# Patient Record
Sex: Female | Born: 1945 | Race: White | Hispanic: No | Marital: Married | Smoking: Never smoker
Health system: Southern US, Community
[De-identification: ages and names within clinical notes are randomized; demographics above are authoritative.]

## PROBLEM LIST (undated history)

## (undated) DIAGNOSIS — I10 Essential (primary) hypertension: Secondary | ICD-10-CM

## (undated) DIAGNOSIS — E119 Type 2 diabetes mellitus without complications: Secondary | ICD-10-CM

## (undated) HISTORY — PX: DILATION AND CURETTAGE OF UTERUS: SHX78

## (undated) HISTORY — DX: Type 2 diabetes mellitus without complications: E11.9

---

## 2014-12-03 ENCOUNTER — Encounter (HOSPITAL_COMMUNITY): Payer: Self-pay | Admitting: Emergency Medicine

## 2014-12-03 ENCOUNTER — Emergency Department (HOSPITAL_COMMUNITY)
Admission: EM | Admit: 2014-12-03 | Discharge: 2014-12-03 | Disposition: A | Discharge status: Home / Self Care | Payer: Medicare Other | Attending: Emergency Medicine | Admitting: Emergency Medicine

## 2014-12-03 DIAGNOSIS — Z7982 Long term (current) use of aspirin: Secondary | ICD-10-CM | POA: Insufficient documentation

## 2014-12-03 DIAGNOSIS — Z79899 Other long term (current) drug therapy: Secondary | ICD-10-CM | POA: Diagnosis not present

## 2014-12-03 DIAGNOSIS — Y998 Other external cause status: Secondary | ICD-10-CM | POA: Diagnosis not present

## 2014-12-03 DIAGNOSIS — Y9289 Other specified places as the place of occurrence of the external cause: Secondary | ICD-10-CM | POA: Diagnosis not present

## 2014-12-03 DIAGNOSIS — I10 Essential (primary) hypertension: Secondary | ICD-10-CM | POA: Insufficient documentation

## 2014-12-03 DIAGNOSIS — Y9389 Activity, other specified: Secondary | ICD-10-CM | POA: Diagnosis not present

## 2014-12-03 DIAGNOSIS — S1086XA Insect bite of other specified part of neck, initial encounter: Secondary | ICD-10-CM | POA: Diagnosis not present

## 2014-12-03 DIAGNOSIS — S1096XA Insect bite of unspecified part of neck, initial encounter: Secondary | ICD-10-CM

## 2014-12-03 DIAGNOSIS — W57XXXA Bitten or stung by nonvenomous insect and other nonvenomous arthropods, initial encounter: Secondary | ICD-10-CM | POA: Insufficient documentation

## 2014-12-03 HISTORY — DX: Essential (primary) hypertension: I10

## 2014-12-03 MED ORDER — CIPROFLOXACIN HCL 500 MG PO TABS: 500.0000 mg | ORAL_TABLET | Freq: Two times a day (BID) | ORAL | Status: DC

## 2014-12-03 NOTE — ED Notes (Signed)
Pt states that she thinks she was bitten on the neck by a spider a few days ago.

## 2014-12-03 NOTE — Discharge Instructions (Signed)
Please apply warm compress to the bite area 2 times daily over the next 4 or 5 days. Please use Cipro 2 times daily with food. Please see your primary physician, or return to the emergency department if any changes, or signs of advancing infection.

## 2014-12-03 NOTE — ED Provider Notes (Signed)
CSN: [id number]     Arrival date & time 12/03/14  1111 History   First MD Initiated Contact with Patient 12/03/14 1202     Chief Complaint  Patient presents with  . Insect Bite     (Consider location/radiation/quality/duration/timing/severity/associated sxs/prior Treatment) HPI Comments: Patient is a 69 year old female who presents to the emergency department with an insect bite to the neck.  The patient states that on Monday, May 9 she noticed a bite to the neck. She later noticed some "brown skin", and pulled this off thinking that it may have been a tick. She discovered it was not a tick, but to day noticed that there was redness present and her family said that it looked like there was some blisters present. She says that she researched it on "[employer]", and thought that it may look like a brown recluse spider bite. She became concerned and came to the emergency department for evaluation. There's been no fever, no chills, no rash, no nausea, no vomiting, and no high fevers reported.  The history is provided by the patient.    Past Medical History  Diagnosis Date  . Hypertension    History reviewed. No pertinent past surgical history. History reviewed. No pertinent family history. History  Substance Use Topics  . Smoking status: Never Smoker   . Smokeless tobacco: Not on file  . Alcohol Use: No   OB History    No data available     Review of Systems  Constitutional: Negative for activity change.       All ROS Neg except as noted in HPI  HENT: Negative for nosebleeds.   Eyes: Negative for photophobia and discharge.  Respiratory: Negative for cough, shortness of breath and wheezing.   Cardiovascular: Negative for chest pain and palpitations.  Gastrointestinal: Negative for abdominal pain and blood in stool.  Genitourinary: Negative for dysuria, frequency and hematuria.  Musculoskeletal: Negative for back pain, arthralgias and neck pain.  Skin: Negative.   Neurological:  Negative for dizziness, seizures and speech difficulty.  Psychiatric/Behavioral: Negative for hallucinations and confusion.      Allergies  Review of patient's allergies indicates no known allergies.  Home Medications   Prior to Admission medications   Medication Sig Start Date End Date Taking? Authorizing Provider  aspirin EC 81 MG tablet Take 81 mg by mouth daily.   Yes Historical Provider, MD  ibuprofen (ADVIL,MOTRIN) 200 MG tablet Take 400 mg by mouth every 6 (six) hours as needed for moderate pain.   Yes Historical Provider, MD  losartan-hydrochlorothiazide (HYZAAR) 100-12.5 MG per tablet Take 1 tablet by mouth daily.   Yes Historical Provider, MD  Omega-3 Fatty Acids (FISH OIL) 1200 MG CAPS Take 1,200 mg by mouth 1 day or 1 dose.   Yes Historical Provider, MD  OVER THE COUNTER MEDICATION Take 1 tablet by mouth daily. O.p.c. Herbal   Yes Historical Provider, MD  TURMERIC PO Take 1 capsule by mouth daily.   Yes Historical Provider, MD   BP 138/85 mmHg  Pulse 95  Temp(Src) 97.8 F (36.6 C) (Oral)  Resp 18  Ht 5\' 5"  (1.651 m)  Wt 230 lb (104.327 kg)  BMI 38.27 kg/m2  SpO2 97% Physical Exam  Constitutional: She is oriented to person, place, and time. She appears well-developed and well-nourished.  Non-toxic appearance.  HENT:  Head: Normocephalic.  Right Ear: Tympanic membrane and external ear normal.  Left Ear: Tympanic membrane and external ear normal.  Eyes: EOM and lids are normal. Pupils  are equal, round, and reactive to light.  Neck: Normal range of motion. Neck supple. Carotid bruit is not present.  There is a nickel size reddened area of the back of the neck. The area is not hot. There is no red streaks going into the scalp. There is no drainage present. There no satellite lesions noted.  Cardiovascular: Normal rate, regular rhythm, normal heart sounds, intact distal pulses and normal pulses.   Pulmonary/Chest: Breath sounds normal. No respiratory distress.   Abdominal: Soft. Bowel sounds are normal. There is no tenderness. There is no guarding.  Musculoskeletal: Normal range of motion.  There is full range of motion of the cervical spine area as well as right and left shoulders without any problem whatsoever.  Lymphadenopathy:       Head (right side): No submandibular adenopathy present.       Head (left side): No submandibular adenopathy present.    She has no cervical adenopathy.  Neurological: She is alert and oriented to person, place, and time. She has normal strength. No cranial nerve deficit or sensory deficit.  No gross neurologic deficits involving the upper extremities, in particular the grip is symmetrical, and the motor strength is equal.  Skin: Skin is warm and dry.  Psychiatric: She has a normal mood and affect. Her speech is normal.  Nursing note and vitals reviewed.   ED Course  Procedures (including critical care time) Labs Review Labs Reviewed - No data to display  Imaging Review No results found.   EKG Interpretation None      MDM  Vital signs are well within normal limits. The bite area of the back of the neck shows no evidence at this time of advancing infection. There is no unusual rash, no high fever, no nausea, no vomiting, and no satellite lesions or bruising. The patient will be asked to use warm compresses to the area, she was placed on a short course of antibiotics prophylactically. The patient is to return to the emergency department immediately if any changes, problems, or concerns.    Final diagnoses:  None    **I have reviewed nursing notes, vital signs, and all appropriate lab and imaging results for this patient.Lily Kocher, PA-C 12/03/14 1238  Milton Ferguson, MD 12/03/14 1452

## 2014-12-03 NOTE — ED Notes (Signed)
Patient with no complaints at this time. Respirations even and unlabored. Skin warm/dry. Discharge instructions reviewed with patient at this time. Patient given opportunity to voice concerns/ask questions. Patient discharged at this time and left Emergency Department with steady gait.   

## 2015-01-27 ENCOUNTER — Other Ambulatory Visit (HOSPITAL_COMMUNITY)
Admission: RE | Admit: 2015-01-27 | Discharge: 2015-01-27 | Disposition: A | Discharge status: Home / Self Care | Payer: Medicare Other | Source: Ambulatory Visit | Attending: Obstetrics & Gynecology | Admitting: Obstetrics & Gynecology

## 2015-01-27 ENCOUNTER — Ambulatory Visit (INDEPENDENT_AMBULATORY_CARE_PROVIDER_SITE_OTHER): Payer: Medicare Other | Admitting: Obstetrics & Gynecology

## 2015-01-27 ENCOUNTER — Encounter: Payer: Self-pay | Admitting: Obstetrics & Gynecology

## 2015-01-27 VITALS — BP 146/86 | HR 92 | Ht 65.0 in | Wt 231.0 lb

## 2015-01-27 DIAGNOSIS — Z124 Encounter for screening for malignant neoplasm of cervix: Secondary | ICD-10-CM

## 2015-01-27 DIAGNOSIS — N950 Postmenopausal bleeding: Secondary | ICD-10-CM | POA: Diagnosis not present

## 2015-01-27 DIAGNOSIS — Z01411 Encounter for gynecological examination (general) (routine) with abnormal findings: Secondary | ICD-10-CM | POA: Diagnosis present

## 2015-01-27 NOTE — Progress Notes (Signed)
Patient ID: Kristina Peterson, female   DOB: 13-Apr-1946, 69 y.o.   MRN: [id number]   Chief Complaint  Patient presents with  . vaginal bleeding and cramping x 3 weeks ago    c/o dull back and side pain    Blood pressure 146/86, pulse 92, height 5\' 5"  (1.651 m), weight 231 lb (104.781 kg).  Patient had new onset of bleeding approximately 3 weeks ago Not much lasted a couple days  Vulva:  normal appearing vulva with no masses, tenderness or lesions Vagina:  normal mucosa, no discharge Cervix:  Normal no lesions no blood seen Uterus:  normal size, contour, position, consistency, mobility, non-tender Adnexa: ovaries:present,  normal adnexa in size, nontender and no masses       Impression/Plan(Problem Based): 1.  PMB      (new problem) : Additional workup is needed:  sonogram    Prescription Drug Management:   New Prescriptions:  Renewed Prescriptions:   Current prescription changes:     Follow Up:   1-2  weeks sonogram  Past Medical History  Diagnosis Date  . Hypertension   . Diabetes mellitus without complication     Past Surgical History  Procedure Laterality Date  . Dilation and curettage of uterus    . Hysteroscopy w/d&c N/A 02/13/2015    Procedure: HYSTEROSCOPY, UTERINE CURETTAGE;  Surgeon: Florian Buff, MD;  Location: [facility];  Service: Gynecology;  Laterality: N/A;    OB History    No data available      No Known Allergies  History   Social History  . Marital Status: Married    Spouse Name: N/A  . Number of Children: N/A  . Years of Education: N/A   Social History Main Topics  . Smoking status: Never Smoker   . Smokeless tobacco: Never Used  . Alcohol Use: No  . Drug Use: No  . Sexual Activity: Yes   Other Topics Concern  . None   Social History Narrative    Family History  Problem Relation Age of Onset  . Cancer Mother     cervical  . Cancer Maternal Aunt     lung  . Cancer Maternal Uncle   . Diabetes Maternal Grandmother   .  Heart disease Paternal Grandfather

## 2015-01-30 LAB — CYTOLOGY - PAP

## 2015-02-05 ENCOUNTER — Ambulatory Visit (INDEPENDENT_AMBULATORY_CARE_PROVIDER_SITE_OTHER): Payer: Medicare Other

## 2015-02-05 ENCOUNTER — Encounter: Payer: Self-pay | Admitting: Obstetrics & Gynecology

## 2015-02-05 ENCOUNTER — Ambulatory Visit (INDEPENDENT_AMBULATORY_CARE_PROVIDER_SITE_OTHER): Payer: Medicare Other | Admitting: Obstetrics & Gynecology

## 2015-02-05 VITALS — BP 110/70 | HR 80 | Wt 229.0 lb

## 2015-02-05 DIAGNOSIS — N950 Postmenopausal bleeding: Secondary | ICD-10-CM

## 2015-02-05 DIAGNOSIS — R9389 Abnormal findings on diagnostic imaging of other specified body structures: Secondary | ICD-10-CM

## 2015-02-05 DIAGNOSIS — R938 Abnormal findings on diagnostic imaging of other specified body structures: Secondary | ICD-10-CM | POA: Diagnosis not present

## 2015-02-05 NOTE — Progress Notes (Signed)
Patient ID: Kristina Peterson, female   DOB: April 28, 1946, 69 y.o.   MRN: [id number] Preoperative History and Physical  Kristina Peterson is a 69 y.o. No obstetric history on file. with No LMP recorded. Patient is postmenopausal. admitted for a hysteroscopy uterine curettage endometrial ablation.  Pt has had post menopausal bleeding and an ultrasound reveals a 17 mm homogenous endometrial stripe  PMH:    Past Medical History  Diagnosis Date  . Hypertension   . Diabetes mellitus without complication     PSH:     Past Surgical History  Procedure Laterality Date  . Dilation and curettage of uterus      POb/GynH:      OB History    No data available      SH:   History  Substance Use Topics  . Smoking status: Never Smoker   . Smokeless tobacco: Never Used  . Alcohol Use: No    FH:    Family History  Problem Relation Age of Onset  . Cancer Mother     cervical  . Cancer Maternal Aunt     lung  . Cancer Maternal Uncle   . Diabetes Maternal Grandmother   . Heart disease Paternal Grandfather      Allergies: No Known Allergies  Medications:       Current outpatient prescriptions:  .  aspirin EC 81 MG tablet, Take 81 mg by mouth daily., Disp: , Rfl:  .  folic acid (FOLVITE) 1 MG tablet, Take 1 mg by mouth daily., Disp: , Rfl:  .  ibuprofen (ADVIL,MOTRIN) 200 MG tablet, Take 400 mg by mouth every 6 (six) hours as needed for moderate pain., Disp: , Rfl:  .  losartan-hydrochlorothiazide (HYZAAR) 100-12.5 MG per tablet, Take 1 tablet by mouth daily., Disp: , Rfl:  .  Omega-3 Fatty Acids (FISH OIL) 1200 MG CAPS, Take 1,200 mg by mouth 1 day or 1 dose., Disp: , Rfl:  .  OVER THE COUNTER MEDICATION, Take 1 tablet by mouth daily. O.p.c. Herbal, Disp: , Rfl:  .  TURMERIC PO, Take 1 capsule by mouth daily., Disp: , Rfl:  .  ciprofloxacin (CIPRO) 500 MG tablet, Take 1 tablet (500 mg total) by mouth 2 (two) times daily. (Patient not taking: Reported on 01/27/2015), Disp: 10 tablet,  Rfl: 0  Review of Systems:   Review of Systems  Constitutional: Negative for fever, chills, weight loss, malaise/fatigue and diaphoresis.  HENT: Negative for hearing loss, ear pain, nosebleeds, congestion, sore throat, neck pain, tinnitus and ear discharge.   Eyes: Negative for blurred vision, double vision, photophobia, pain, discharge and redness.  Respiratory: Negative for cough, hemoptysis, sputum production, shortness of breath, wheezing and stridor.   Cardiovascular: Negative for chest pain, palpitations, orthopnea, claudication, leg swelling and PND.  Gastrointestinal: Positive for abdominal pain. Negative for heartburn, nausea, vomiting, diarrhea, constipation, blood in stool and melena.  Genitourinary: Negative for dysuria, urgency, frequency, hematuria and flank pain.  Musculoskeletal: Negative for myalgias, back pain, joint pain and falls.  Skin: Negative for itching and rash.  Neurological: Negative for dizziness, tingling, tremors, sensory change, speech change, focal weakness, seizures, loss of consciousness, weakness and headaches.  Endo/Heme/Allergies: Negative for environmental allergies and polydipsia. Does not bruise/bleed easily.  Psychiatric/Behavioral: Negative for depression, suicidal ideas, hallucinations, memory loss and substance abuse. The patient is not nervous/anxious and does not have insomnia.      PHYSICAL EXAM:  Blood pressure 110/70, pulse 80, weight 229 lb (103.874 kg).  Vitals reviewed. Constitutional: She is oriented to person, place, and time. She appears well-developed and well-nourished.  HENT:  Head: Normocephalic and atraumatic.  Right Ear: External ear normal.  Left Ear: External ear normal.  Nose: Nose normal.  Mouth/Throat: Oropharynx is clear and moist.  Eyes: Conjunctivae and EOM are normal. Pupils are equal, round, and reactive to light. Right eye exhibits no discharge. Left eye exhibits no discharge. No scleral icterus.  Neck:  Normal range of motion. Neck supple. No tracheal deviation present. No thyromegaly present.  Cardiovascular: Normal rate, regular rhythm, normal heart sounds and intact distal pulses.  Exam reveals no gallop and no friction rub.   No murmur heard. Respiratory: Effort normal and breath sounds normal. No respiratory distress. She has no wheezes. She has no rales. She exhibits no tenderness.  GI: Soft. Bowel sounds are normal. She exhibits no distension and no mass. There is tenderness. There is no rebound and no guarding.  Genitourinary:       Vulva is normal without lesions Vagina is pink moist without discharge Cervix normal in appearance and pap is normal Uterus is normal size, contour, position, consistency, mobility, non-tender Adnexa is negative with normal sized ovaries by sonogram  Musculoskeletal: Normal range of motion. She exhibits no edema and no tenderness.  Neurological: She is alert and oriented to person, place, and time. She has normal reflexes. She displays normal reflexes. No cranial nerve deficit. She exhibits normal muscle tone. Coordination normal.  Skin: Skin is warm and dry. No rash noted. No erythema. No pallor.  Psychiatric: She has a normal mood and affect. Her behavior is normal. Judgment and thought content normal.    Labs: Results for orders placed or performed in visit on 01/27/15 (from the past 336 hour(s))  Cytology - PAP   Collection Time: 01/27/15 12:00 AM  Result Value Ref Range   CYTOLOGY - PAP PAP RESULT     EKG: No orders found for this or any previous visit.  Imaging Studies: US Transvaginal Non-ob  03/04/2015   GYNECOLOGIC SONOGRAM   Kristina Peterson is a 69 y.o. postmenopausal woman here for a pelvic  sonogram for vaginal bleeding.  Uterus                      6.4 x 4.5 x 3.5 cm, wnl  Endometrium          17.0 mm, symmetrical, thickened endometrium w/color  flow.  Right ovary             1.7 x 1 x 1.7 cm, wnl  Left ovary                1.6 x  1.9 x 1.5 cm, wnl    Technician Comments:  US PELVIC TV/TA: normal anteverted uterus,thick EEC 17.53mm w/color  flow,normal ov's bilat,no pain during ultrasound.    Kristina Peterson 03/04/2015 1:36 PM  Clinical Impression and recommendations:  I have reviewed the sonogram results above, combined with the patient's  current clinical course, below are my impressions and any appropriate  recommendations for management based on the sonographic findings.  Thickened endometrium, homogenous Normal uterus and ovaries otherwise   Kaely Hollan H March 04, 2015 2:14 PM    US Pelvis Complete  Mar 04, 2015   GYNECOLOGIC SONOGRAM   Akisha Schmidtke is a 69 y.o. postmenopausal woman here for a pelvic  sonogram for vaginal bleeding.  Uterus  6.4 x 4.5 x 3.5 cm, wnl  Endometrium          17.0 mm, symmetrical, thickened endometrium w/color  flow.  Right ovary             1.7 x 1 x 1.7 cm, wnl  Left ovary                1.6 x 1.9 x 1.5 cm, wnl    Technician Comments:  US PELVIC TV/TA: normal anteverted uterus,thick EEC 17.38mm w/color  flow,normal ov's bilat,no pain during ultrasound.    Kristina Peterson 02/05/2015 1:36 PM  Clinical Impression and recommendations:  I have reviewed the sonogram results above, combined with the patient's  current clinical course, below are my impressions and any appropriate  recommendations for management based on the sonographic findings.  Thickened endometrium, homogenous Normal uterus and ovaries otherwise   Enes Wegener H 02/05/2015 2:14 PM       Assessment: Post menopausal bleeding Thickened endometrial stripe (evaluate for the presence of endometrial cancer)  Plan: Hysteroscopy uterine curettage   Sharifa Bucholz H 02/05/2015 2:25 PM

## 2015-02-05 NOTE — Progress Notes (Signed)
US PELVIC TV/TA: normal anteverted uterus,thick EEC 17.44mm w/color flow,normal ov's bilat

## 2015-02-10 NOTE — Patient Instructions (Addendum)
Kristina Peterson  02/10/2015     @PREFPERIOPPHARMACY @   Your procedure is scheduled on 02/13/2015.  Report to [facility] at 6:15 A.M.  Call this number if you have problems the morning of surgery:  [phone]   Remember:  Do not eat food or drink liquids after midnight.  Take these medicines the morning of surgery with A SIP OF WATER :  Hyzaar and Folic Acid   Do not wear jewelry, make-up or nail polish.  Do not wear lotions, powders, or perfumes.  You may wear deodorant.  Do not shave 48 hours prior to surgery.  Men may shave face and neck.  Do not bring valuables to the hospital.  [facility] is not responsible for any belongings or valuables.  Contacts, dentures or bridgework may not be worn into surgery.  Leave your suitcase in the car.  After surgery it may be brought to your room.  For patients admitted to the hospital, discharge time will be determined by your treatment team.  Patients discharged the day of surgery will not be allowed to drive home.   Name and phone number of your driver:   family Special instructions:  n/a  Please read over the following fact sheets that you were given. Care and Recovery After Surgery    Hysteroscopy Hysteroscopy is a procedure used for looking inside the womb (uterus). It may be done for various reasons, including:  To evaluate abnormal bleeding, fibroid (benign, noncancerous) tumors, polyps, scar tissue (adhesions), and possibly cancer of the uterus.  To look for lumps (tumors) and other uterine growths.  To look for causes of why a woman cannot get pregnant (infertility), causes of recurrent loss of pregnancy (miscarriages), or a lost intrauterine device (IUD).  To perform a sterilization by blocking the fallopian tubes from inside the uterus. In this procedure, a thin, flexible tube with a tiny light and camera on the end of it (hysteroscope) is used to look inside the uterus. A hysteroscopy should be done right after a  menstrual period to be sure you are not pregnant. LET [facility] CARE PROVIDER KNOW ABOUT:   Any allergies you have.  All medicines you are taking, including vitamins, herbs, eye drops, creams, and over-the-counter medicines.  Previous problems you or members of your family have had with the use of anesthetics.  Any blood disorders you have.  Previous surgeries you have had.  Medical conditions you have. RISKS AND COMPLICATIONS  Generally, this is a safe procedure. However, as with any procedure, complications can occur. Possible complications include:  Putting a hole in the uterus.  Excessive bleeding.  Infection.  Damage to the cervix.  Injury to other organs.  Allergic reaction to medicines.  Too much fluid used in the uterus for the procedure. BEFORE THE PROCEDURE   Ask your health care provider about changing or stopping any regular medicines.  Do not take aspirin or blood thinners for 1 week before the procedure, or as directed by your health care provider. These can cause bleeding.  If you smoke, do not smoke for 2 weeks before the procedure.  In some cases, a medicine is placed in the cervix the day before the procedure. This medicine makes the cervix have a larger opening (dilate). This makes it easier for the instrument to be inserted into the uterus during the procedure.  Do not eat or drink anything for at least 8 hours before the surgery.  Arrange for someone to take you home  after the procedure. PROCEDURE   You may be given a medicine to relax you (sedative). You may also be given one of the following:  A medicine that numbs the area around the cervix (local anesthetic).  A medicine that makes you sleep through the procedure (general anesthetic).  The hysteroscope is inserted through the vagina into the uterus. The camera on the hysteroscope sends a picture to a TV screen. This gives the surgeon a good view inside the uterus.  During the procedure,  air or a liquid is put into the uterus, which allows the surgeon to see better.  Sometimes, tissue is gently scraped from inside the uterus. These tissue samples are sent to a lab for testing. AFTER THE PROCEDURE   If you had a general anesthetic, you may be groggy for a couple hours after the procedure.  If you had a local anesthetic, you will be able to go home as soon as you are stable and feel ready.  You may have some cramping. This normally lasts for a couple days.  You may have bleeding, which varies from light spotting for a few days to menstrual-like bleeding for 3-7 days. This is normal.  If your test results are not back during the visit, make an appointment with your health care provider to find out the results. Document Released: 10/17/2000 Document Revised: 05/01/2013 Document Reviewed: 02/07/2013 [facility] Patient Information 2015 [place], [place]. This information is not intended to replace advice given to you by your health care provider. Make sure you discuss any questions you have with your health care provider.

## 2015-02-11 ENCOUNTER — Other Ambulatory Visit: Payer: Self-pay

## 2015-02-11 ENCOUNTER — Encounter (HOSPITAL_COMMUNITY)
Admission: RE | Admit: 2015-02-11 | Discharge: 2015-02-11 | Disposition: A | Discharge status: Home / Self Care | Payer: Medicare Other | Source: Ambulatory Visit | Attending: Obstetrics & Gynecology | Admitting: Obstetrics & Gynecology

## 2015-02-11 ENCOUNTER — Encounter (HOSPITAL_COMMUNITY): Payer: Self-pay

## 2015-02-11 DIAGNOSIS — I10 Essential (primary) hypertension: Secondary | ICD-10-CM | POA: Diagnosis not present

## 2015-02-11 DIAGNOSIS — R000 Tachycardia, unspecified: Secondary | ICD-10-CM | POA: Diagnosis not present

## 2015-02-11 DIAGNOSIS — E119 Type 2 diabetes mellitus without complications: Secondary | ICD-10-CM | POA: Diagnosis not present

## 2015-02-11 DIAGNOSIS — Z7982 Long term (current) use of aspirin: Secondary | ICD-10-CM | POA: Diagnosis not present

## 2015-02-11 DIAGNOSIS — C541 Malignant neoplasm of endometrium: Secondary | ICD-10-CM | POA: Diagnosis not present

## 2015-02-11 DIAGNOSIS — N950 Postmenopausal bleeding: Secondary | ICD-10-CM | POA: Diagnosis present

## 2015-02-11 DIAGNOSIS — Z79899 Other long term (current) drug therapy: Secondary | ICD-10-CM | POA: Diagnosis not present

## 2015-02-11 LAB — COMPREHENSIVE METABOLIC PANEL
ALT: 21 U/L (ref 14–54)
AST: 24 U/L (ref 15–41)
Albumin: 4.5 g/dL (ref 3.5–5.0)
Alkaline Phosphatase: 54 U/L (ref 38–126)
Anion gap: 10 (ref 5–15)
BILIRUBIN TOTAL: 0.7 mg/dL (ref 0.3–1.2)
BUN: 21 mg/dL — ABNORMAL HIGH (ref 6–20)
CO2: 25 mmol/L (ref 22–32)
CREATININE: 1.45 mg/dL — AB (ref 0.44–1.00)
Calcium: 10.2 mg/dL (ref 8.9–10.3)
Chloride: 102 mmol/L (ref 101–111)
GFR calc Af Amer: 42 mL/min — ABNORMAL LOW (ref >60)
GFR calc non Af Amer: 36 mL/min — ABNORMAL LOW (ref >60)
Glucose, Bld: 173 mg/dL — ABNORMAL HIGH (ref 65–99)
Potassium: 3.8 mmol/L (ref 3.5–5.1)
Sodium: 137 mmol/L (ref 135–145)
Total Protein: 7.5 g/dL (ref 6.5–8.1)

## 2015-02-11 LAB — CBC
HCT: 36.9 % (ref 36.0–46.0)
HEMOGLOBIN: 13.3 g/dL (ref 12.0–15.0)
MCH: 31.7 pg (ref 26.0–34.0)
MCHC: 36 g/dL (ref 30.0–36.0)
MCV: 87.9 fL (ref 78.0–100.0)
Platelets: 169 10*3/uL (ref 150–400)
RBC: 4.2 MIL/uL (ref 3.87–5.11)
RDW: 12.4 % (ref 11.5–15.5)
WBC: 4.9 10*3/uL (ref 4.0–10.5)

## 2015-02-11 LAB — URINALYSIS, ROUTINE W REFLEX MICROSCOPIC
BILIRUBIN URINE: NEGATIVE
Glucose, UA: NEGATIVE mg/dL
Ketones, ur: NEGATIVE mg/dL
Leukocytes, UA: NEGATIVE
Nitrite: NEGATIVE
PROTEIN: NEGATIVE mg/dL
Specific Gravity, Urine: 1.01 (ref 1.005–1.030)
Urobilinogen, UA: 0.2 mg/dL (ref 0.0–1.0)
pH: 6 (ref 5.0–8.0)

## 2015-02-11 LAB — URINE MICROSCOPIC-ADD ON

## 2015-02-13 ENCOUNTER — Encounter (HOSPITAL_COMMUNITY)
Admission: RE | Disposition: A | Discharge status: Home / Self Care | Payer: Self-pay | Source: Ambulatory Visit | Attending: Obstetrics & Gynecology

## 2015-02-13 ENCOUNTER — Ambulatory Visit (HOSPITAL_COMMUNITY)
Admission: RE | Admit: 2015-02-13 | Discharge: 2015-02-13 | Disposition: A | Discharge status: Home / Self Care | Payer: Medicare Other | Source: Ambulatory Visit | Attending: Obstetrics & Gynecology | Admitting: Obstetrics & Gynecology

## 2015-02-13 ENCOUNTER — Ambulatory Visit (HOSPITAL_COMMUNITY): Payer: Medicare Other | Admitting: Anesthesiology

## 2015-02-13 ENCOUNTER — Encounter (HOSPITAL_COMMUNITY): Payer: Self-pay | Admitting: *Deleted

## 2015-02-13 DIAGNOSIS — Z7982 Long term (current) use of aspirin: Secondary | ICD-10-CM | POA: Insufficient documentation

## 2015-02-13 DIAGNOSIS — E119 Type 2 diabetes mellitus without complications: Secondary | ICD-10-CM | POA: Insufficient documentation

## 2015-02-13 DIAGNOSIS — N950 Postmenopausal bleeding: Secondary | ICD-10-CM | POA: Insufficient documentation

## 2015-02-13 DIAGNOSIS — R000 Tachycardia, unspecified: Secondary | ICD-10-CM | POA: Insufficient documentation

## 2015-02-13 DIAGNOSIS — C541 Malignant neoplasm of endometrium: Secondary | ICD-10-CM | POA: Insufficient documentation

## 2015-02-13 DIAGNOSIS — N8502 Endometrial intraepithelial neoplasia [EIN]: Secondary | ICD-10-CM | POA: Diagnosis not present

## 2015-02-13 DIAGNOSIS — I10 Essential (primary) hypertension: Secondary | ICD-10-CM | POA: Insufficient documentation

## 2015-02-13 DIAGNOSIS — Z79899 Other long term (current) drug therapy: Secondary | ICD-10-CM | POA: Insufficient documentation

## 2015-02-13 HISTORY — PX: HYSTEROSCOPY W/D&C: SHX1775

## 2015-02-13 LAB — GLUCOSE, CAPILLARY
GLUCOSE-CAPILLARY: 110 mg/dL — AB (ref 65–99)
Glucose-Capillary: 112 mg/dL — ABNORMAL HIGH (ref 65–99)

## 2015-02-13 SURGERY — DILATATION AND CURETTAGE /HYSTEROSCOPY
Anesthesia: General

## 2015-02-13 MED ORDER — FENTANYL CITRATE (PF) 100 MCG/2ML IJ SOLN
INTRAMUSCULAR | Status: DC | PRN
  Administered 2015-02-13 (×2): 25 ug via INTRAVENOUS
  Administered 2015-02-13: 50 ug via INTRAVENOUS

## 2015-02-13 MED ORDER — ARTIFICIAL TEARS OP OINT
TOPICAL_OINTMENT | OPHTHALMIC | Status: AC
  Filled 2015-02-13: qty 3.5

## 2015-02-13 MED ORDER — ONDANSETRON HCL 4 MG/2ML IJ SOLN
INTRAMUSCULAR | Status: AC
  Filled 2015-02-13: qty 2

## 2015-02-13 MED ORDER — MIDAZOLAM HCL 2 MG/2ML IJ SOLN
INTRAMUSCULAR | Status: AC
  Filled 2015-02-13: qty 2

## 2015-02-13 MED ORDER — CEFAZOLIN SODIUM-DEXTROSE 2-3 GM-% IV SOLR
2.0000 g | INTRAVENOUS | Status: AC
  Administered 2015-02-13: 2 g via INTRAVENOUS
  Filled 2015-02-13: qty 50

## 2015-02-13 MED ORDER — SODIUM CHLORIDE 0.9 % IJ SOLN
INTRAMUSCULAR | Status: AC
  Filled 2015-02-13: qty 10

## 2015-02-13 MED ORDER — MIDAZOLAM HCL 2 MG/2ML IJ SOLN
1.0000 mg | INTRAMUSCULAR | Status: DC | PRN
Start: 2015-02-13 — End: 2015-02-13
  Administered 2015-02-13: 2 mg via INTRAVENOUS

## 2015-02-13 MED ORDER — ONDANSETRON HCL 4 MG/2ML IJ SOLN
4.0000 mg | Freq: Once | INTRAMUSCULAR | Status: AC
  Administered 2015-02-13: 4 mg via INTRAVENOUS

## 2015-02-13 MED ORDER — FENTANYL CITRATE (PF) 100 MCG/2ML IJ SOLN
INTRAMUSCULAR | Status: AC
  Filled 2015-02-13: qty 2

## 2015-02-13 MED ORDER — KETOROLAC TROMETHAMINE 30 MG/ML IJ SOLN
30.0000 mg | Freq: Once | INTRAMUSCULAR | Status: AC
  Administered 2015-02-13: 30 mg via INTRAVENOUS
  Filled 2015-02-13: qty 1

## 2015-02-13 MED ORDER — LACTATED RINGERS IV SOLN
INTRAVENOUS | Status: DC
  Administered 2015-02-13: 1000 mL via INTRAVENOUS

## 2015-02-13 MED ORDER — EPHEDRINE SULFATE 50 MG/ML IJ SOLN
INTRAMUSCULAR | Status: DC | PRN
  Administered 2015-02-13: 5 mg via INTRAVENOUS
  Administered 2015-02-13: 10 mg via INTRAVENOUS

## 2015-02-13 MED ORDER — PROPOFOL 10 MG/ML IV BOLUS
INTRAVENOUS | Status: AC
  Filled 2015-02-13: qty 20

## 2015-02-13 MED ORDER — PROPOFOL 10 MG/ML IV BOLUS
INTRAVENOUS | Status: DC | PRN
  Administered 2015-02-13: 50 mg via INTRAVENOUS
  Administered 2015-02-13: 20 mg via INTRAVENOUS

## 2015-02-13 MED ORDER — EPHEDRINE SULFATE 50 MG/ML IJ SOLN
INTRAMUSCULAR | Status: AC
  Filled 2015-02-13: qty 1

## 2015-02-13 MED ORDER — PHENYLEPHRINE 40 MCG/ML (10ML) SYRINGE FOR IV PUSH (FOR BLOOD PRESSURE SUPPORT)
PREFILLED_SYRINGE | INTRAVENOUS | Status: AC
  Filled 2015-02-13: qty 10

## 2015-02-13 MED ORDER — SUCCINYLCHOLINE CHLORIDE 20 MG/ML IJ SOLN
INTRAMUSCULAR | Status: AC
  Filled 2015-02-13: qty 1

## 2015-02-13 MED ORDER — HYDROCODONE-ACETAMINOPHEN 5-325 MG PO TABS: 1.0000 | ORAL_TABLET | Freq: Four times a day (QID) | ORAL | Status: DC | PRN

## 2015-02-13 MED ORDER — ONDANSETRON HCL 8 MG PO TABS: 8.0000 mg | ORAL_TABLET | Freq: Three times a day (TID) | ORAL | Status: DC | PRN

## 2015-02-13 MED ORDER — ONDANSETRON HCL 4 MG/2ML IJ SOLN: 4.0000 mg | Freq: Once | INTRAMUSCULAR | Status: DC | PRN

## 2015-02-13 MED ORDER — KETOROLAC TROMETHAMINE 10 MG PO TABS: 10.0000 mg | ORAL_TABLET | Freq: Three times a day (TID) | ORAL | Status: DC | PRN

## 2015-02-13 MED ORDER — LIDOCAINE HCL (PF) 1 % IJ SOLN
INTRAMUSCULAR | Status: AC
  Filled 2015-02-13: qty 5

## 2015-02-13 MED ORDER — FENTANYL CITRATE (PF) 100 MCG/2ML IJ SOLN: 25.0000 ug | INTRAMUSCULAR | Status: DC | PRN

## 2015-02-13 MED ORDER — FENTANYL CITRATE (PF) 100 MCG/2ML IJ SOLN
25.0000 ug | INTRAMUSCULAR | Status: AC
  Administered 2015-02-13 (×2): 25 ug via INTRAVENOUS

## 2015-02-13 MED ORDER — SODIUM CHLORIDE 0.9 % IR SOLN
Status: DC | PRN
Start: 2015-02-13 — End: 2015-02-13
  Administered 2015-02-13: 1000 mL

## 2015-02-13 SURGICAL SUPPLY — 29 items
BAG HAMPER (MISCELLANEOUS) ×3 IMPLANT
CLOTH BEACON ORANGE TIMEOUT ST (SAFETY) ×3 IMPLANT
COVER LIGHT HANDLE STERIS (MISCELLANEOUS) ×6 IMPLANT
FORMALIN 10 PREFIL 120ML (MISCELLANEOUS) ×3 IMPLANT
GAUZE SPONGE 4X4 16PLY XRAY LF (GAUZE/BANDAGES/DRESSINGS) ×3 IMPLANT
GLOVE BIOGEL PI IND STRL 7.0 (GLOVE) ×2 IMPLANT
GLOVE BIOGEL PI IND STRL 7.5 (GLOVE) ×1 IMPLANT
GLOVE BIOGEL PI IND STRL 8 (GLOVE) ×1 IMPLANT
GLOVE BIOGEL PI INDICATOR 7.0 (GLOVE) ×4
GLOVE BIOGEL PI INDICATOR 7.5 (GLOVE) ×2
GLOVE BIOGEL PI INDICATOR 8 (GLOVE) ×2
GLOVE ECLIPSE 6.5 STRL STRAW (GLOVE) ×3 IMPLANT
GLOVE ECLIPSE 8.0 STRL XLNG CF (GLOVE) ×3 IMPLANT
GOWN STRL REUS W/TWL LRG LVL3 (GOWN DISPOSABLE) ×3 IMPLANT
GOWN STRL REUS W/TWL XL LVL3 (GOWN DISPOSABLE) ×3 IMPLANT
INST SET HYSTEROSCOPY (KITS) ×3 IMPLANT
IV NS 1000ML (IV SOLUTION) ×2
IV NS 1000ML BAXH (IV SOLUTION) ×1 IMPLANT
KIT ROOM TURNOVER AP CYSTO (KITS) ×3 IMPLANT
MANIFOLD NEPTUNE II (INSTRUMENTS) ×3 IMPLANT
NS IRRIG 1000ML POUR BTL (IV SOLUTION) ×3 IMPLANT
PACK BASIC III (CUSTOM PROCEDURE TRAY) ×2
PACK SRG BSC III STRL LF ECLPS (CUSTOM PROCEDURE TRAY) ×1 IMPLANT
PAD ARMBOARD 7.5X6 YLW CONV (MISCELLANEOUS) ×3 IMPLANT
PAD TELFA 3X4 1S STER (GAUZE/BANDAGES/DRESSINGS) ×3 IMPLANT
SET IRRIG Y TYPE TUR BLADDER L (SET/KITS/TRAYS/PACK) ×3 IMPLANT
SHEET LAVH (DRAPES) ×3 IMPLANT
TOWEL OR 17X26 4PK STRL BLUE (TOWEL DISPOSABLE) ×3 IMPLANT
YANKAUER SUCT BULB TIP 10FT TU (MISCELLANEOUS) ×3 IMPLANT

## 2015-02-13 NOTE — Anesthesia Preprocedure Evaluation (Signed)
Anesthesia Evaluation  Patient identified by MRN, date of birth, ID band Patient awake    Reviewed: Allergy & Precautions, NPO status , Patient's Chart, lab work & pertinent test results  Airway Mallampati: II  TM Distance: >3 FB     Dental  (+) Teeth Intact, Implants   Pulmonary neg pulmonary ROS,  breath sounds clear to auscultation        Cardiovascular hypertension, Pt. on medications Rhythm:Regular Rate:Normal     Neuro/Psych    GI/Hepatic negative GI ROS,   Endo/Other  diabetes, Type 2  Renal/GU      Musculoskeletal   Abdominal   Peds  Hematology  (+) J[facility]'S WITNESS  Anesthesia Other Findings   Reproductive/Obstetrics                             Anesthesia Physical Anesthesia Plan  ASA: II  Anesthesia Plan: General   Post-op Pain Management:    Induction: Intravenous  Airway Management Planned: LMA  Additional Equipment:   Intra-op Plan:   Post-operative Plan:   Informed Consent: I have reviewed the patients History and Physical, chart, labs and discussed the procedure including the risks, benefits and alternatives for the proposed anesthesia with the patient or authorized representative who has indicated his/her understanding and acceptance.     Plan Discussed with:   Anesthesia Plan Comments:         Anesthesia Quick Evaluation

## 2015-02-13 NOTE — Transfer of Care (Signed)
Immediate Anesthesia Transfer of Care Note  Patient: Kristina Peterson  Procedure(s) Performed: Procedure(s): HYSTEROSCOPY, UTERINE CURETTAGE (N/A)  Patient Location: PACU  Anesthesia Type:General  Level of Consciousness: awake, oriented and patient cooperative  Airway & Oxygen Therapy: Patient Spontanous Breathing and Patient connected to face mask oxygen  Post-op Assessment: Report given to RN and Post -op Vital signs reviewed and stable  Post vital signs: Reviewed and stable  Last Vitals:  Filed Vitals:   02/13/15 0730  BP: 117/72  Pulse:   Temp:   Resp: 13    Complications: No apparent anesthesia complications

## 2015-02-13 NOTE — Anesthesia Procedure Notes (Signed)
Procedure Name: LMA Insertion Date/Time: 02/13/2015 7:42 AM Performed by: Andree Elk, AMY A Pre-anesthesia Checklist: Patient identified, Timeout performed, Emergency Drugs available, Suction available and Patient being monitored Patient Re-evaluated:Patient Re-evaluated prior to inductionOxygen Delivery Method: Circle system utilized Preoxygenation: Pre-oxygenation with 100% oxygen Intubation Type: IV induction Ventilation: Mask ventilation without difficulty LMA: LMA inserted LMA Size: 4.0 Number of attempts: 1 Placement Confirmation: positive ETCO2 and breath sounds checked- equal and bilateral Tube secured with: Tape Dental Injury: Teeth and Oropharynx as per pre-operative assessment

## 2015-02-13 NOTE — Op Note (Signed)
Preoperative diagnosis: Postmenopausal bleeding                                         Thickened endometrium, concerniong for hyperplasia or malignancy     Postoperative diagnosis: same as above   Procedure:  Hysteroscopy uterine curettage  Surgeon: Florian Buff   Anesthesia: Laryngeal mask airway   Findings: The patient experienced postmenopausal bleeding and we did a sonogram in the office which revealed a thickened endometrial stripe of 17 mm.   This morning the patient had 2 areas of polypoid endometrial lesions concerning for hyperplasia or endometrial cancer.  The endometrium globally appeared normally, focally there were these areas of concern on the posterior wall.  Description of operation: Patient was taken to the operating room and placed in the supine position where she underwent laryngeal mask airway anesthesia. She was placed in the dorsal lithotomy position.  She was prepped and draped in the usual sterile fashion.  A Graves speculum was placed.  The cervix was grasped with a single-tooth tenaculum.  The cervix was dilated serially using Hegar dilators to allow passage of the hysteroscope.  The hysteroscope was then placed into the endometrial cavity without difficulty and the above noted findings were seen.  The endometrium was thoroughly curetted with good uterine cry in all areas  Hysteroscopic reevaluation confirmed the removal of all endometrial pathology  There was good hemostasis. The patient tolerated the procedure well  She experienced minimal blood loss  She was taken to the recovery room in good stable condition  All counts were correct x3  She received 2 g of Ancef and 30 mg of Toradol preoperatively  She will be followed up in the office next week for postoperative visit and to review the pathology   Florian Buff, MD 02/13/2015 8:10 AM

## 2015-02-13 NOTE — Discharge Instructions (Signed)
Hysteroscopy, Care After °Refer to this sheet in the next few weeks. These instructions provide you with information on caring for yourself after your procedure. Your health care provider may also give you more specific instructions. Your treatment has been planned according to current medical practices, but problems sometimes occur. Call your health care provider if you have any problems or questions after your procedure.  °WHAT TO EXPECT AFTER THE PROCEDURE °After your procedure, it is typical to have the following: °· You may have some cramping. This normally lasts for a couple days. °· You may have bleeding. This can vary from light spotting for a few days to menstrual-like bleeding for 3-7 days. °HOME CARE INSTRUCTIONS °· Rest for the first 1-2 days after the procedure. °· Only take over-the-counter or prescription medicines as directed by your health care provider. Do not take aspirin. It can increase the chances of bleeding. °· Take showers instead of baths for 2 weeks or as directed by your health care provider. °· Do not drive for 24 hours or as directed. °· Do not drink alcohol while taking pain medicine. °· Do not use tampons, douche, or have sexual intercourse for 2 weeks or until your health care provider says it is okay. °· Take your temperature twice a day for 4-5 days. Write it down each time. °· Follow your health care provider's advice about diet, exercise, and lifting. °· If you develop constipation, you may: °¨ Take a mild laxative if your health care provider approves. °¨ Add bran foods to your diet. °¨ Drink enough fluids to keep your urine clear or pale yellow. °· Try to have someone with you or available to you for the first 24-48 hours, especially if you were given a general anesthetic. °· Follow up with your health care provider as directed. °SEEK MEDICAL CARE IF: °· You feel dizzy or lightheaded. °· You feel sick to your stomach (nauseous). °· You have abnormal vaginal discharge. °· You  have a rash. °· You have pain that is not controlled with medicine. °SEEK IMMEDIATE MEDICAL CARE IF: °· You have bleeding that is heavier than a normal menstrual period. °· You have a fever. °· You have increasing cramps or pain, not controlled with medicine. °· You have new belly (abdominal) pain. °· You pass out. °· You have pain in the tops of your shoulders (shoulder strap areas). °· You have shortness of breath. °Document Released: 05/01/2013 Document Reviewed: 05/01/2013 °[employer]® Patient Information ©2015 [employer]. This information is not intended to replace advice given to you by your health care provider. Make sure you discuss any questions you have with your health care provider. ° °

## 2015-02-13 NOTE — Anesthesia Postprocedure Evaluation (Signed)
  Anesthesia Post-op Note  Patient: Kristina Peterson  Procedure(s) Performed: Procedure(s): HYSTEROSCOPY, UTERINE CURETTAGE (N/A)  Patient Location: PACU  Anesthesia Type:General  Level of Consciousness: awake, alert , oriented and patient cooperative  Airway and Oxygen Therapy: Patient Spontanous Breathing  Post-op Pain: mild  Post-op Assessment: Post-op Vital signs reviewed, Patient's Cardiovascular Status Stable, Respiratory Function Stable, Patent Airway, No signs of Nausea or vomiting and Pain level controlled              Post-op Vital Signs: Reviewed and stable  Last Vitals:  Filed Vitals:   02/13/15 0815  BP: 144/73  Pulse: 94  Temp: 36.4 C  Resp: 11    Complications: No apparent anesthesia complications

## 2015-02-13 NOTE — H&P (Signed)
Preoperative History and Physical  Kristina Peterson is a 69 y.o. No obstetric history on file. with No LMP recorded. Patient is postmenopausal. admitted for a hysteroscopy uterine curettage  Pt has had post menopausal bleeding and an ultrasound reveals a 17 mm homogenous endometrial stripe  PMH:  Past Medical History  Diagnosis Date  . Hypertension   . Diabetes mellitus without complication     PSH:  Past Surgical History  Procedure Laterality Date  . Dilation and curettage of uterus      POb/GynH:  OB History    No data available      SH:  History  Substance Use Topics  . Smoking status: Never Smoker   . Smokeless tobacco: Never Used  . Alcohol Use: No    FH:  Family History  Problem Relation Age of Onset  . Cancer Mother     cervical  . Cancer Maternal Aunt     lung  . Cancer Maternal Uncle   . Diabetes Maternal Grandmother   . Heart disease Paternal Grandfather      Allergies: No Known Allergies  Medications:  Current outpatient prescriptions:  . aspirin EC 81 MG tablet, Take 81 mg by mouth daily., Disp: , Rfl:  . folic acid (FOLVITE) 1 MG tablet, Take 1 mg by mouth daily., Disp: , Rfl:  . ibuprofen (ADVIL,MOTRIN) 200 MG tablet, Take 400 mg by mouth every 6 (six) hours as needed for moderate pain., Disp: , Rfl:  . losartan-hydrochlorothiazide (HYZAAR) 100-12.5 MG per tablet, Take 1 tablet by mouth daily., Disp: , Rfl:  . Omega-3 Fatty Acids (FISH OIL) 1200 MG CAPS, Take 1,200 mg by mouth 1 day or 1 dose., Disp: , Rfl:  . OVER THE COUNTER MEDICATION, Take 1 tablet by mouth daily. O.p.c. Herbal, Disp: , Rfl:  . TURMERIC PO, Take 1 capsule by mouth daily., Disp: , Rfl:  . ciprofloxacin (CIPRO) 500 MG tablet, Take 1 tablet (500 mg total) by mouth 2 (two) times daily. (Patient not taking: Reported on 01/27/2015), Disp: 10 tablet, Rfl: 0  Review of Systems:    Review of Systems  Constitutional: Negative for fever, chills, weight loss, malaise/fatigue and diaphoresis.  HENT: Negative for hearing loss, ear pain, nosebleeds, congestion, sore throat, neck pain, tinnitus and ear discharge.  Eyes: Negative for blurred vision, double vision, photophobia, pain, discharge and redness.  Respiratory: Negative for cough, hemoptysis, sputum production, shortness of breath, wheezing and stridor.  Cardiovascular: Negative for chest pain, palpitations, orthopnea, claudication, leg swelling and PND.  Gastrointestinal: Positive for abdominal pain. Negative for heartburn, nausea, vomiting, diarrhea, constipation, blood in stool and melena.  Genitourinary: Negative for dysuria, urgency, frequency, hematuria and flank pain.  Musculoskeletal: Negative for myalgias, back pain, joint pain and falls.  Skin: Negative for itching and rash.  Neurological: Negative for dizziness, tingling, tremors, sensory change, speech change, focal weakness, seizures, loss of consciousness, weakness and headaches.  Endo/Heme/Allergies: Negative for environmental allergies and polydipsia. Does not bruise/bleed easily.  Psychiatric/Behavioral: Negative for depression, suicidal ideas, hallucinations, memory loss and substance abuse. The patient is not nervous/anxious and does not have insomnia.     PHYSICAL EXAM:  Blood pressure 110/70, pulse 80, weight 229 lb (103.874 kg).   Vitals reviewed. Constitutional: She is oriented to person, place, and time. She appears well-developed and well-nourished.  HENT:  Head: Normocephalic and atraumatic.  Right Ear: External ear normal.  Left Ear: External ear normal.  Nose: Nose normal.  Mouth/Throat: Oropharynx is clear and moist.  Eyes: Conjunctivae and EOM are normal. Pupils are equal, round, and reactive to light. Right eye exhibits no discharge. Left eye exhibits no discharge. No scleral icterus.  Neck: Normal range of motion. Neck  supple. No tracheal deviation present. No thyromegaly present.  Cardiovascular: Normal rate, regular rhythm, normal heart sounds and intact distal pulses. Exam reveals no gallop and no friction rub.  No murmur heard. Respiratory: Effort normal and breath sounds normal. No respiratory distress. She has no wheezes. She has no rales. She exhibits no tenderness.  GI: Soft. Bowel sounds are normal. She exhibits no distension and no mass. There is tenderness. There is no rebound and no guarding.  Genitourinary:   Vulva is normal without lesions Vagina is pink moist without discharge Cervix normal in appearance and pap is normal Uterus is normal size, contour, position, consistency, mobility, non-tender Adnexa is negative with normal sized ovaries by sonogram  Musculoskeletal: Normal range of motion. She exhibits no edema and no tenderness.  Neurological: She is alert and oriented to person, place, and time. She has normal reflexes. She displays normal reflexes. No cranial nerve deficit. She exhibits normal muscle tone. Coordination normal.  Skin: Skin is warm and dry. No rash noted. No erythema. No pallor.  Psychiatric: She has a normal mood and affect. Her behavior is normal. Judgment and thought content normal.    Labs: Results for orders placed or performed in visit on 01/27/15 (from the past 336 hour(s))  Cytology - PAP   Collection Time: 01/27/15 12:00 AM  Result Value Ref Range   CYTOLOGY - PAP PAP RESULT     EKG: No orders found for this or any previous visit.  Imaging Studies:  Imaging Results    US Transvaginal Non-ob  02/05/2015 GYNECOLOGIC SONOGRAM Kristina Peterson is a 69 y.o. postmenopausal woman here for a pelvic sonogram for vaginal bleeding. Uterus  6.4 x 4.5 x 3.5 cm, wnl Endometrium 17.0 mm, symmetrical, thickened endometrium w/color flow. Right ovary 1.7 x 1 x 1.7 cm, wnl Left ovary  1.6  x 1.9 x 1.5 cm, wnl Technician Comments: US PELVIC TV/TA: normal anteverted uterus,thick EEC 17.61mm w/color flow,normal ov's bilat,no pain during ultrasound. Kristina Peterson 02/05/2015 1:36 PM Clinical Impression and recommendations: I have reviewed the sonogram results above, combined with the patient's current clinical course, below are my impressions and any appropriate recommendations for management based on the sonographic findings. Thickened endometrium, homogenous Normal uterus and ovaries otherwise Javelle Donigan H 02/05/2015 2:14 PM   US Pelvis Complete  02/05/2015 GYNECOLOGIC SONOGRAM Taliyha Hayakawa is a 69 y.o. postmenopausal woman here for a pelvic sonogram for vaginal bleeding. Uterus  6.4 x 4.5 x 3.5 cm, wnl Endometrium 17.0 mm, symmetrical, thickened endometrium w/color flow. Right ovary 1.7 x 1 x 1.7 cm, wnl Left ovary  1.6 x 1.9 x 1.5 cm, wnl Technician Comments: US PELVIC TV/TA: normal anteverted uterus,thick EEC 17.52mm w/color flow,normal ov's bilat,no pain during ultrasound. Kristina Peterson 02/05/2015 1:36 PM Clinical Impression and recommendations: I have reviewed the sonogram results above, combined with the patient's current clinical course, below are my impressions and any appropriate recommendations for management based on the sonographic findings. Thickened endometrium, homogenous Normal uterus and ovaries otherwise Candus Braud H 02/05/2015 2:14 PM       Assessment: Post menopausal bleeding Thickened endometrial stripe (evaluate for the presence of endometrial cancer)  Plan: Hysteroscopy uterine curettage

## 2015-02-16 ENCOUNTER — Encounter (HOSPITAL_COMMUNITY): Payer: Self-pay | Admitting: Obstetrics & Gynecology

## 2015-02-20 ENCOUNTER — Encounter: Payer: Self-pay | Admitting: Obstetrics & Gynecology

## 2015-02-20 ENCOUNTER — Ambulatory Visit (INDEPENDENT_AMBULATORY_CARE_PROVIDER_SITE_OTHER): Payer: Medicare Other | Admitting: Obstetrics & Gynecology

## 2015-02-20 VITALS — BP 120/70 | HR 76 | Wt 230.0 lb

## 2015-02-20 DIAGNOSIS — C541 Malignant neoplasm of endometrium: Secondary | ICD-10-CM | POA: Diagnosis not present

## 2015-02-20 NOTE — Progress Notes (Signed)
Patient ID: Kristina Peterson, female   DOB: 1946/06/19, 69 y.o.   MRN: [id number]  HPI: Patient returns for routine postoperative follow-up having undergone hysteroscopy uterine curettage on 02/11/2015.  The patient's immediate postoperative recovery has been unremarkable. Since hospital discharge the patient reports minimal bleeding.   Current Outpatient Prescriptions: folic acid (FOLVITE) 1 MG tablet, Take 1 mg by mouth daily., Disp: , Rfl:  losartan-hydrochlorothiazide (HYZAAR) 100-12.5 MG per tablet, Take 1 tablet by mouth daily., Disp: , Rfl:  Omega-3 Fatty Acids (FISH OIL) 1200 MG CAPS, Take 1,200 mg by mouth 1 day or 1 dose., Disp: , Rfl:  OVER THE COUNTER MEDICATION, Take 1 tablet by mouth daily. O.p.c. Herbal, Disp: , Rfl:  aspirin EC 81 MG tablet, Take 81 mg by mouth daily., Disp: , Rfl:  HYDROcodone-acetaminophen (NORCO/VICODIN) 5-325 MG per tablet, Take 1 tablet by mouth every 6 (six) hours as needed. (Patient not taking: Reported on 02/20/2015), Disp: 30 tablet, Rfl: 0 ibuprofen (ADVIL,MOTRIN) 200 MG tablet, Take 400 mg by mouth every 6 (six) hours as needed for moderate pain., Disp: , Rfl:  ketorolac (TORADOL) 10 MG tablet, Take 1 tablet (10 mg total) by mouth every 8 (eight) hours as needed. (Patient not taking: Reported on 02/20/2015), Disp: 15 tablet, Rfl: 0 ondansetron (ZOFRAN) 8 MG tablet, Take 1 tablet (8 mg total) by mouth every 8 (eight) hours as needed for nausea. (Patient not taking: Reported on 02/20/2015), Disp: 12 tablet, Rfl: 0  No current facility-administered medications for this visit.    Blood pressure 120/70, pulse 76, weight 230 lb (104.327 kg).  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

## 2015-02-23 ENCOUNTER — Telehealth: Payer: Self-pay | Admitting: Obstetrics & Gynecology

## 2015-02-23 NOTE — Telephone Encounter (Addendum)
Pt states DX with Endometrial Cancer, Dr. Elonda Husky to refer to GYN oncologist. Pt states after talking with a close friend she would like to be referred to a Dr. Fay Records, Oncologist in [place] (fax [phone]). Pt informed Dr. Elonda Husky out of the office until next Tuesday. Will discuss with Dr. Elonda Husky and f/u with pt. Pt verbalized understanding.

## 2015-02-24 ENCOUNTER — Ambulatory Visit (HOSPITAL_COMMUNITY)
Admission: RE | Admit: 2015-02-24 | Discharge: 2015-02-24 | Disposition: A | Discharge status: Home / Self Care | Payer: Medicare Other | Source: Ambulatory Visit | Attending: Obstetrics & Gynecology | Admitting: Obstetrics & Gynecology

## 2015-02-24 ENCOUNTER — Encounter (HOSPITAL_COMMUNITY): Payer: Self-pay

## 2015-02-24 DIAGNOSIS — R911 Solitary pulmonary nodule: Secondary | ICD-10-CM | POA: Insufficient documentation

## 2015-02-24 DIAGNOSIS — C541 Malignant neoplasm of endometrium: Secondary | ICD-10-CM | POA: Diagnosis not present

## 2015-02-24 DIAGNOSIS — K5730 Diverticulosis of large intestine without perforation or abscess without bleeding: Secondary | ICD-10-CM | POA: Insufficient documentation

## 2015-02-24 MED ORDER — IOHEXOL 300 MG/ML  SOLN
100.0000 mL | Freq: Once | INTRAMUSCULAR | Status: AC | PRN
  Administered 2015-02-24: 80 mL via INTRAVENOUS

## 2015-02-26 ENCOUNTER — Other Ambulatory Visit (HOSPITAL_COMMUNITY): Payer: Medicare Other

## 2015-03-03 ENCOUNTER — Telehealth: Payer: Self-pay | Admitting: Obstetrics & Gynecology

## 2015-03-03 NOTE — Telephone Encounter (Signed)
Pt informed Dr. Elonda Husky back in office today message routed to him in regards to referral to Dr. Woodfin Gan[facility].

## 2015-03-04 ENCOUNTER — Telehealth: Payer: Self-pay | Admitting: Nurse Practitioner

## 2015-03-04 ENCOUNTER — Telehealth: Payer: Self-pay | Admitting: Obstetrics & Gynecology

## 2015-03-04 NOTE — Telephone Encounter (Signed)
Pt informed per Dr. Elonda Husky CT scan from 02/24/2015 showed Diverticulosis only.   Spoke with Joaquim Lai from Dr. Rhodia Albright office and was informed pt records would need to be sent to their office for Dr. Rhodia Albright to review prior to appt being made. Once reviewed pt would be contacted for appt date and time. Records faxed and pt informed referral completed and Dr. Rhodia Albright office would contact her with date and time. Pt verbalized understanding.

## 2015-03-04 NOTE — Telephone Encounter (Signed)
This RN called Dr. Brynda Greathouse office to inform them the patient canceled her apt with Dr. Fermin Schwab and will be seeing MD in [facility]. Angie to inform Dr. Elonda Husky.

## 2015-03-04 NOTE — Telephone Encounter (Signed)
RN received message from medical records that patient would like to cancel 8/12 apt. Rn called patient to verify. She has decided to pursue MD in [place]. She is asked to consider [place] if we can help her in the future; pt thanks for the call. Apt cancelled per request.

## 2015-03-06 ENCOUNTER — Ambulatory Visit: Payer: Medicare Other | Admitting: Gynecology

## 2015-03-14 NOTE — Telephone Encounter (Signed)
Noted  

## 2016-09-04 IMAGING — CT CT ABD-PELV W/ CM
2 of 5 series · 13 of 36 positions shown, 16 images · IV contrast (omnipaque)
Comparison: Chest CT 06/12/2011. CT of the abdomen and pelvis
05/30/2011.

CLINICAL DATA: 69-year-old female with history of newly diagnosed
endometrial cancer last week. Vaginal spotting for the past 2
months.

EXAM:
CT CHEST, ABDOMEN, AND PELVIS WITH CONTRAST
TECHNIQUE: Multidetector CT imaging of the chest, abdomen and pelvis was
performed following the standard protocol during bolus
administration of intravenous contrast.
CONTRAST:  80mL OMNIPAQUE IOHEXOL 300 MG/ML  SOLN

[Series 2: cap with 5.0 b40f · axial · 0.83mm/px · z∈[+460,+1014]mm · 10 of 135 slices shown, 13 images]
[im 12/135  mediastinal]
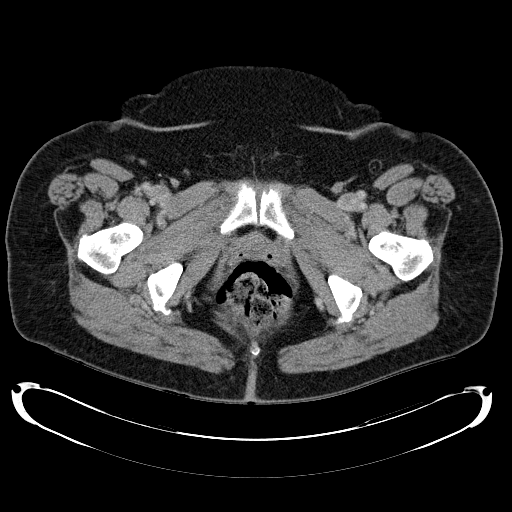
[im 12/135  lung]
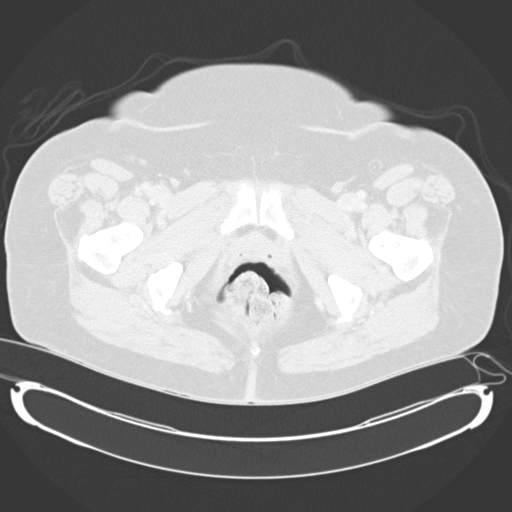
[im 23/135  lung]
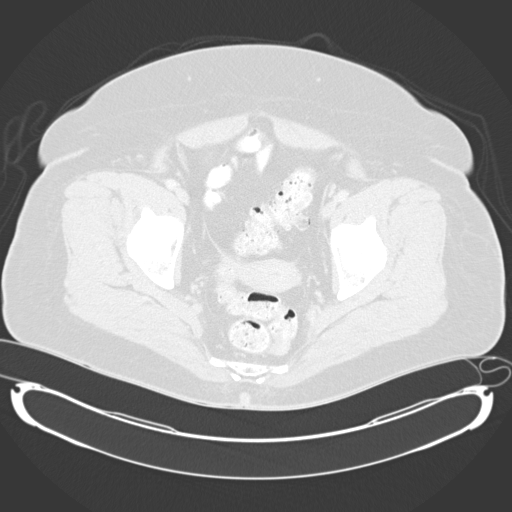
[im 34/135  lung]
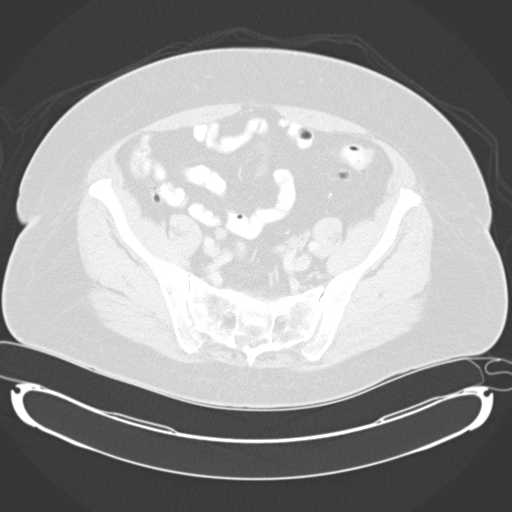
[im 45/135  lung]
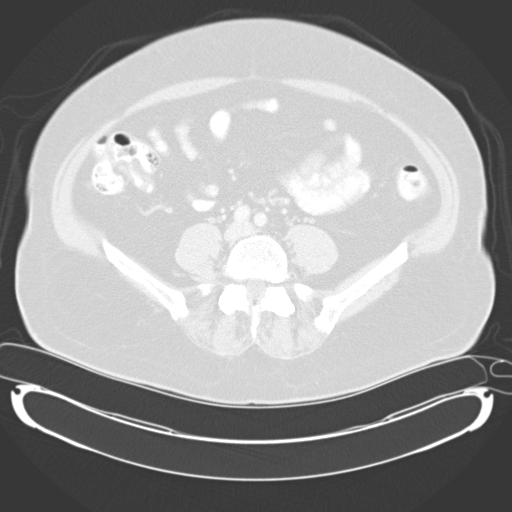
[im 56/135  mediastinal]
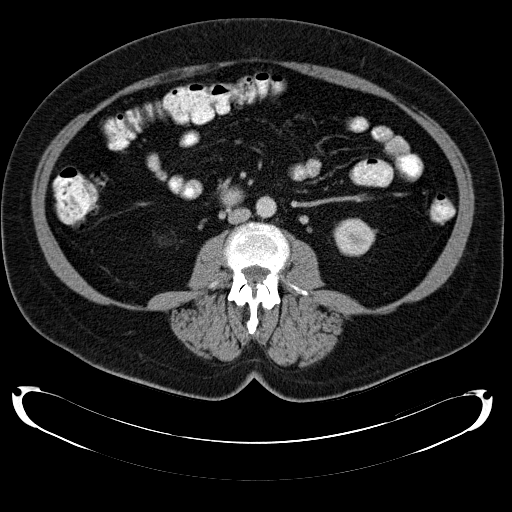
[im 56/135  lung]
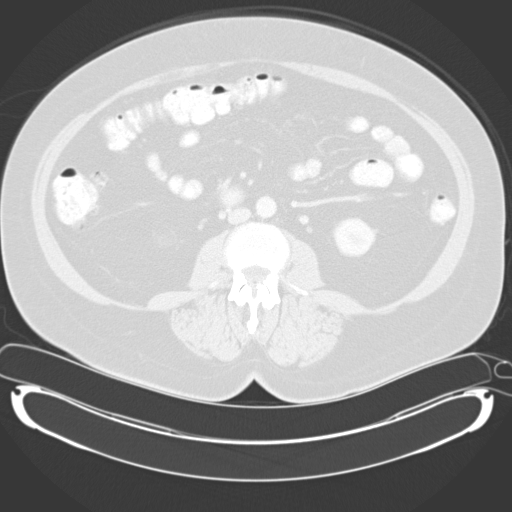
[im 79/135  lung]
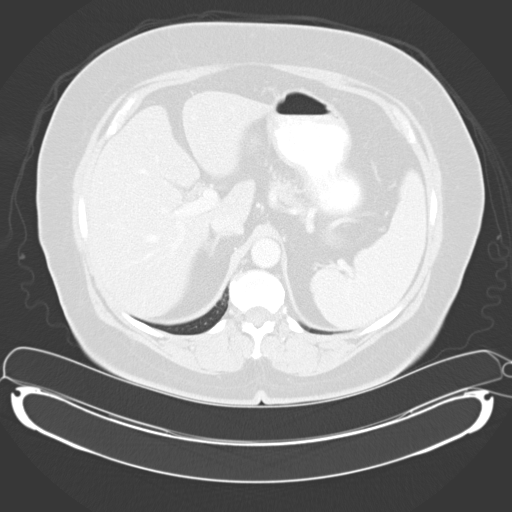
[im 90/135  lung]
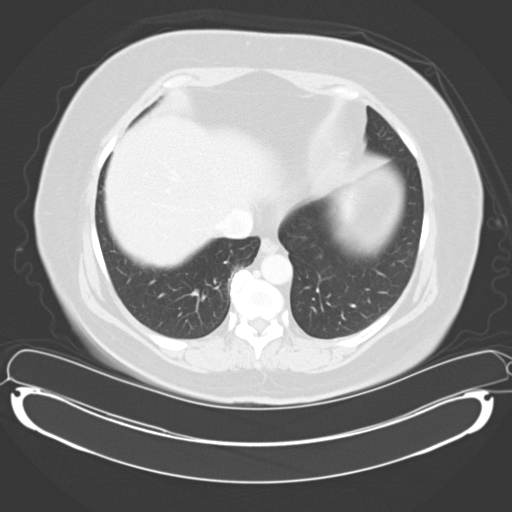
[im 101/135  lung]
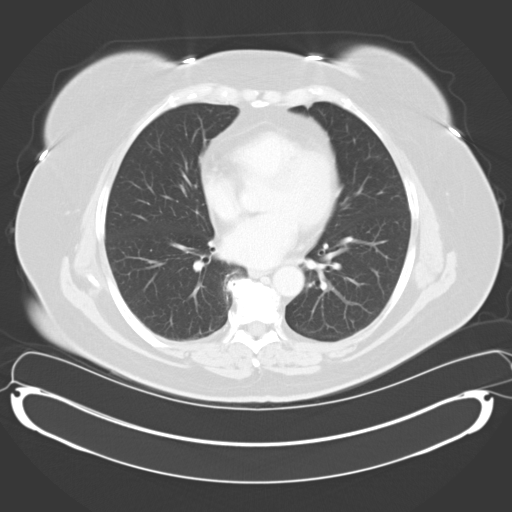
[im 112/135  mediastinal]
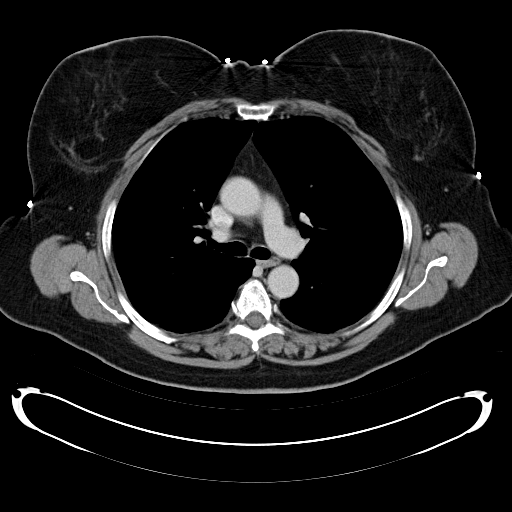
[im 112/135  lung]
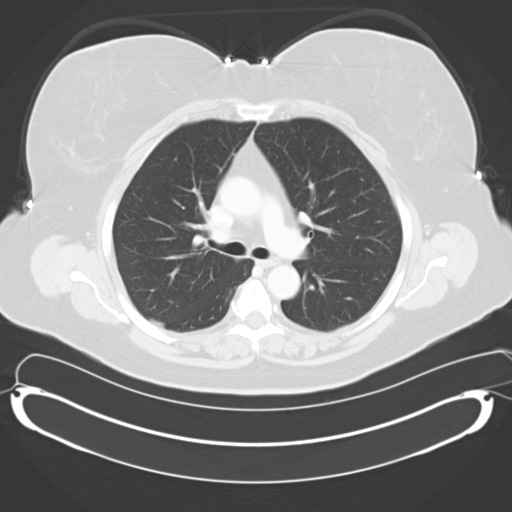
[im 123/135  lung]
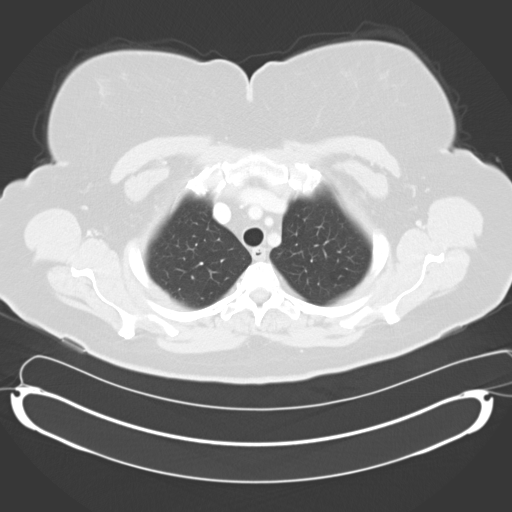

[Series 4: mpr cor post contrast (id) · coronal · 0.81mm/px · 3 of 104 slices shown]
[im 21/104  lung]
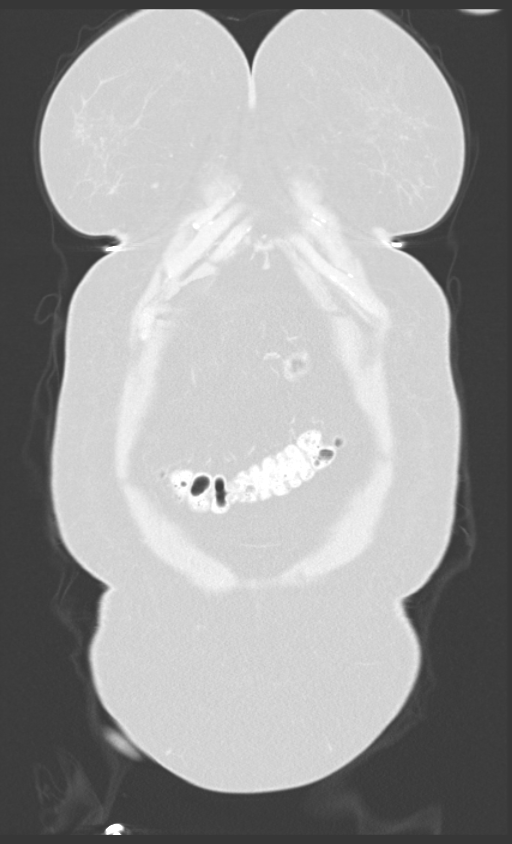
[im 42/104  lung]
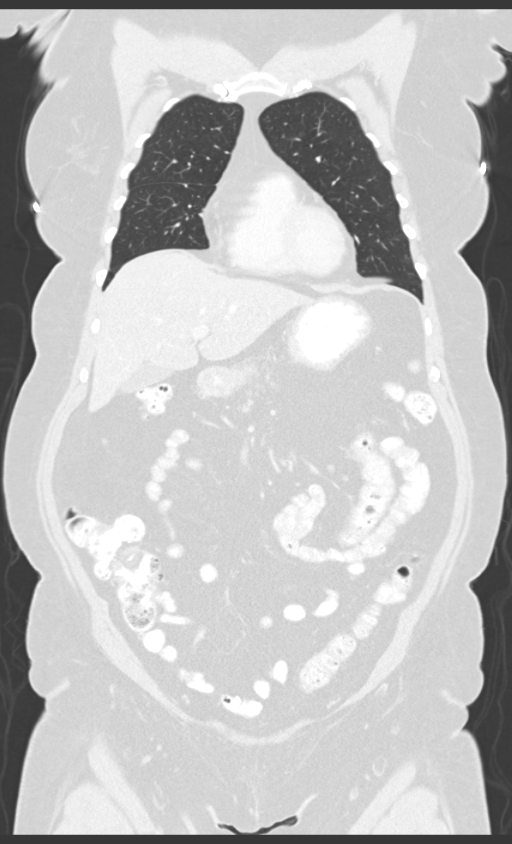
[im 62/104  lung]
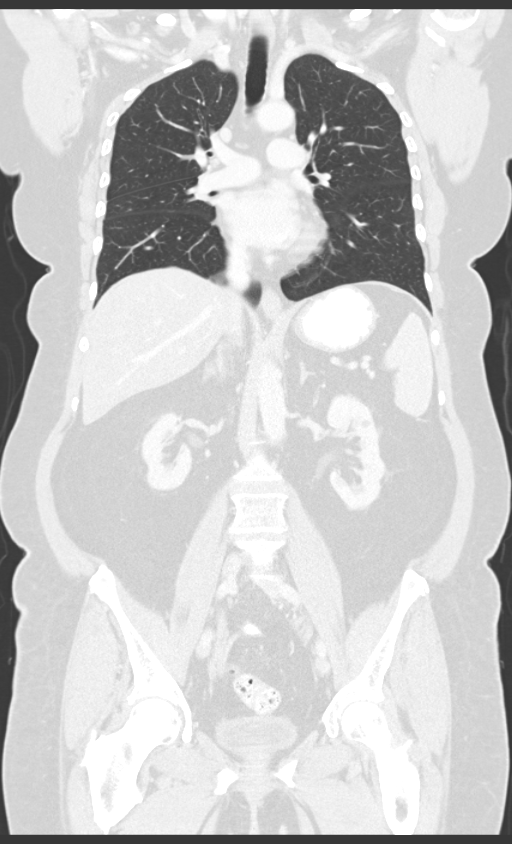

[13 of 36 positions shown; findings below may reference images not displayed]

FINDINGS: CT CHEST FINDINGS

Mediastinum/Lymph Nodes: Heart size is normal. There is no
significant pericardial fluid, thickening or pericardial
calcification. No pathologically enlarged mediastinal or hilar lymph
nodes. Esophagus is unremarkable. No axillary lymphadenopathy.

Lungs/Pleura: 3 mm pulmonary nodule in the right upper lobe (image
19 of series 3) is highly nonspecific. No larger more suspicious
appearing pulmonary nodules or masses are otherwise noted. No acute
consolidative airspace disease. No pleural effusions.

Musculoskeletal/Soft Tissues: There are no aggressive appearing
lytic or blastic lesions noted in the visualized portions of the
skeleton.

CT ABDOMEN AND PELVIS FINDINGS

Hepatobiliary: No cystic or solid hepatic lesions. No intra or
extrahepatic biliary ductal dilatation. Gallbladder is normal in
appearance.

Pancreas: No pancreatic mass. No pancreatic ductal dilatation. No
pancreatic or peripancreatic fluid or inflammatory changes.

Spleen: Unremarkable.

Adrenals/Urinary Tract: 3 sub cm low-attenuation lesions in the left
kidney are too small to definitively characterize, but statistically
favored to represent tiny cysts. Right kidney is normal in
appearance. No hydroureteronephrosis. Urinary bladder is normal in
appearance. Bilateral adrenal glands are normal.

Stomach/Bowel: The appearance of the stomach is normal. No
pathologic dilatation of small bowel or colon. Numerous colonic
diverticulae are noted, particularly in the descending and sigmoid
colon, without surrounding inflammatory changes to suggest an acute
diverticulitis at this time. Normal appendix.

Vascular/Lymphatic: No significant atherosclerotic disease, aneurysm
or dissection identified in the abdominal or pelvic vasculature. No
lymphadenopathy noted in the abdomen or pelvis.

Reproductive: Uterus is grossly unremarkable in appearance.
Specifically, no large endometrial mass identified by CT imaging.
Ovaries appear atrophic.

Other: No significant volume of ascites.  No pneumoperitoneum.

Musculoskeletal: There are no aggressive appearing lytic or blastic
lesions noted in the visualized portions of the skeleton.
IMPRESSION: 1. No definitive findings in the chest, abdomen or pelvis to suggest
metastatic disease. The uterus is grossly unremarkable in
appearance.
2. Tiny 3 mm right upper lobe pulmonary nodule is noted, but highly
nonspecific. While a metastatic lesion is not excluded, and is not
favored, and attention on follow-up chest CT in 1 year is
recommended at this time.
3. Colonic diverticulosis without evidence of acute diverticulitis
at this time.
4. Normal appendix.

## 2018-05-07 ENCOUNTER — Encounter (INDEPENDENT_AMBULATORY_CARE_PROVIDER_SITE_OTHER): Payer: Self-pay

## 2018-05-07 ENCOUNTER — Ambulatory Visit: Payer: Medicare HMO | Admitting: Obstetrics & Gynecology

## 2018-05-07 ENCOUNTER — Other Ambulatory Visit: Payer: Self-pay

## 2018-05-07 ENCOUNTER — Other Ambulatory Visit (HOSPITAL_COMMUNITY)
Admission: RE | Admit: 2018-05-07 | Discharge: 2018-05-07 | Disposition: A | Discharge status: Home / Self Care | Payer: Medicare HMO | Source: Ambulatory Visit | Attending: Obstetrics & Gynecology | Admitting: Obstetrics & Gynecology

## 2018-05-07 ENCOUNTER — Encounter: Payer: Self-pay | Admitting: Obstetrics & Gynecology

## 2018-05-07 VITALS — BP 126/72 | HR 86 | Ht 65.0 in | Wt 225.5 lb

## 2018-05-07 DIAGNOSIS — C541 Malignant neoplasm of endometrium: Secondary | ICD-10-CM

## 2018-05-07 DIAGNOSIS — Z1272 Encounter for screening for malignant neoplasm of vagina: Secondary | ICD-10-CM

## 2018-05-07 NOTE — Progress Notes (Signed)
Chief Complaint  Patient presents with  . Vaginal Bleeding      72 y.o. [id number] No LMP recorded. Patient has had a hysterectomy. The current method of family planning is status post hysterectomy.  Outpatient Encounter Medications as of 05/07/2018  Medication Sig Note  . aspirin EC 81 MG tablet Take 81 mg by mouth daily. 02/09/2015: On hold due to procedure.   . folic acid (FOLVITE) 1 MG tablet Take 1 mg by mouth daily.   Marland Kitchen losartan-hydrochlorothiazide (HYZAAR) 100-12.5 MG per tablet Take 1 tablet by mouth daily.   . Omega-3 Fatty Acids (FISH OIL) 1200 MG CAPS Take 1,200 mg by mouth 1 day or 1 dose.   . [DISCONTINUED] HYDROcodone-acetaminophen (NORCO/VICODIN) 5-325 MG per tablet Take 1 tablet by mouth every 6 (six) hours as needed. (Patient not taking: Reported on 02/20/2015)   . [DISCONTINUED] ibuprofen (ADVIL,MOTRIN) 200 MG tablet Take 400 mg by mouth every 6 (six) hours as needed for moderate pain.   . [DISCONTINUED] ketorolac (TORADOL) 10 MG tablet Take 1 tablet (10 mg total) by mouth every 8 (eight) hours as needed. (Patient not taking: Reported on 02/20/2015)   . [DISCONTINUED] ondansetron (ZOFRAN) 8 MG tablet Take 1 tablet (8 mg total) by mouth every 8 (eight) hours as needed for nausea. (Patient not taking: Reported on 02/20/2015)   . [DISCONTINUED] OVER THE COUNTER MEDICATION Take 1 tablet by mouth daily. O.p.c. Herbal    No facility-administered encounter medications on file as of 05/07/2018.     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 Diabetes mellitus without complication ([place])   . Hypertension     Past Surgical History:  Procedure Laterality Date  . DILATION AND CURETTAGE OF UTERUS    . HYSTEROSCOPY W/D&C N/A 02/13/2015   Procedure: HYSTEROSCOPY, UTERINE  CURETTAGE;  Surgeon: Florian Buff, MD;  Location: [facility];  Service: Gynecology;  Laterality: N/A;    OB History    Gravida  5   Para  4   Term  4   Preterm      AB  1   Living  4     SAB  1   TAB      Ectopic      Multiple      Live Births              No Known Allergies  Social History   Socioeconomic History  . Marital status: Married    Spouse name: Not on file  . Number of children: Not on file  . Years of education: Not on file  . Highest education level: Not on file  Occupational History  . Not on file  Social Needs  . Financial resource strain: Not on file  . Food insecurity:    Worry: Not on file    Inability: Not on file  . Transportation needs:    Medical: Not on file    Non-medical: Not on file  Tobacco Use  . Smoking status: Never Smoker  . Smokeless tobacco: Never Used  Substance and Sexual Activity  . Alcohol use: No    Alcohol/week: 0.0 standard drinks  . Drug use: No  . Sexual activity: Not Currently  Lifestyle  . Physical activity:  Days per week: Not on file    Minutes per session: Not on file  . Stress: Not on file  Relationships  . Social connections:    Talks on phone: Not on file    Gets together: Not on file    Attends religious service: Not on file    Active member of club or organization: Not on file    Attends meetings of clubs or organizations: Not on file    Relationship status: Not on file  Other Topics Concern  . Not on file  Social History Narrative  . Not on file    Family History  Problem Relation Age of Onset  . Cancer Mother        cervical  . Cancer Maternal Aunt        lung  . Cancer Maternal Uncle   . Diabetes Maternal Grandmother   . Heart disease Paternal Grandfather     Medications:       Current Outpatient Medications:  .  aspirin EC 81 MG tablet, Take 81 mg by mouth daily., Disp: , Rfl:  .  folic acid (FOLVITE) 1 MG tablet, Take 1 mg by mouth daily., Disp: , Rfl:  .   losartan-hydrochlorothiazide (HYZAAR) 100-12.5 MG per tablet, Take 1 tablet by mouth daily., Disp: , Rfl:  .  Omega-3 Fatty Acids (FISH OIL) 1200 MG CAPS, Take 1,200 mg by mouth 1 day or 1 dose., Disp: , Rfl:   Objective Blood pressure 126/72, pulse 86, height 5\' 5"  (1.651 m), weight 225 lb 8 oz (102.3 kg).  General WDWN female NAD Vulva:  normal appearing vulva with no masses, tenderness or lesions Vagina:  Atrophic no lesions seen cuff intact no lesion Cytology obtained Cervix:  absent Uterus:  uterus absent Adnexa: ovaries:not present,   Rectal normal tone no masses negative hemoccult   Pertinent ROS No burning with urination, frequency or urgency No nausea, vomiting or diarrhea Nor fever chills or other constitutional symptoms   Labs or studies  Hemoccult is negative today   Impression Diagnoses this Encounter::   ICD-10-CM   1. Endometrial cancer, grade I ([place]) C54.1    01/2015 s/p hsyterectomy  2. Vaginal Pap smear Z12.72 Cytology - PAP    Established relevant diagnosis(es):   Plan/Recommendations: No orders of the defined types were placed in this encounter.   Labs or Scans Ordered: No orders of the defined types were placed in this encounter.   Management:: No source seen, probably just atrophic vaginal changes Cytology obtained  Follow up Return in about 1 year (around 05/08/2019) for yearly, with Dr Elonda Husky.      All questions were answered.

## 2018-05-10 LAB — CYTOLOGY - PAP
DIAGNOSIS: NEGATIVE
HPV: NOT DETECTED

## 2018-06-11 DIAGNOSIS — K5730 Diverticulosis of large intestine without perforation or abscess without bleeding: Secondary | ICD-10-CM | POA: Insufficient documentation

## 2020-04-28 DIAGNOSIS — IMO0001 Reserved for inherently not codable concepts without codable children: Secondary | ICD-10-CM | POA: Insufficient documentation

## 2020-04-28 DIAGNOSIS — Z531 Procedure and treatment not carried out because of patient's decision for reasons of belief and group pressure: Secondary | ICD-10-CM | POA: Insufficient documentation

## 2020-04-29 DIAGNOSIS — I2510 Atherosclerotic heart disease of native coronary artery without angina pectoris: Secondary | ICD-10-CM | POA: Insufficient documentation

## 2021-01-19 ENCOUNTER — Telehealth: Payer: Self-pay

## 2021-01-19 NOTE — Telephone Encounter (Signed)
Advised that Dr. Lajuana Ripple is not accepting any new patients at this time.

## 2021-04-07 ENCOUNTER — Ambulatory Visit: Payer: Medicare HMO | Admitting: Family Medicine

## 2021-05-20 LAB — HM DIABETES EYE EXAM

## 2021-07-22 ENCOUNTER — Encounter: Payer: Self-pay | Admitting: Family Medicine

## 2021-07-22 ENCOUNTER — Ambulatory Visit (INDEPENDENT_AMBULATORY_CARE_PROVIDER_SITE_OTHER): Payer: Medicare HMO | Admitting: Family Medicine

## 2021-07-22 VITALS — BP 144/76 | HR 58 | Temp 97.9°F | Ht 65.0 in | Wt 227.0 lb

## 2021-07-22 DIAGNOSIS — Z8542 Personal history of malignant neoplasm of other parts of uterus: Secondary | ICD-10-CM | POA: Insufficient documentation

## 2021-07-22 DIAGNOSIS — E1122 Type 2 diabetes mellitus with diabetic chronic kidney disease: Secondary | ICD-10-CM

## 2021-07-22 DIAGNOSIS — E559 Vitamin D deficiency, unspecified: Secondary | ICD-10-CM

## 2021-07-22 DIAGNOSIS — I152 Hypertension secondary to endocrine disorders: Secondary | ICD-10-CM

## 2021-07-22 DIAGNOSIS — E1159 Type 2 diabetes mellitus with other circulatory complications: Secondary | ICD-10-CM

## 2021-07-22 DIAGNOSIS — N184 Chronic kidney disease, stage 4 (severe): Secondary | ICD-10-CM

## 2021-07-22 DIAGNOSIS — E785 Hyperlipidemia, unspecified: Secondary | ICD-10-CM

## 2021-07-22 DIAGNOSIS — E1169 Type 2 diabetes mellitus with other specified complication: Secondary | ICD-10-CM | POA: Diagnosis not present

## 2021-07-22 DIAGNOSIS — E119 Type 2 diabetes mellitus without complications: Secondary | ICD-10-CM | POA: Insufficient documentation

## 2021-07-22 DIAGNOSIS — IMO0001 Reserved for inherently not codable concepts without codable children: Secondary | ICD-10-CM | POA: Insufficient documentation

## 2021-07-22 DIAGNOSIS — I2510 Atherosclerotic heart disease of native coronary artery without angina pectoris: Secondary | ICD-10-CM

## 2021-07-22 LAB — BAYER DCA HB A1C WAIVED: HB A1C (BAYER DCA - WAIVED): 8.3 % — ABNORMAL HIGH (ref 4.8–5.6)

## 2021-07-22 MED ORDER — TIRZEPATIDE 5 MG/0.5ML ~~LOC~~ SOAJ: 5.0000 mg | SUBCUTANEOUS | 0 refills | Status: DC

## 2021-07-22 MED ORDER — TIRZEPATIDE 2.5 MG/0.5ML ~~LOC~~ SOAJ: 2.5000 mg | SUBCUTANEOUS | 0 refills | Status: DC

## 2021-07-22 NOTE — Progress Notes (Signed)
Subjective:  Patient ID: Kristina Peterson, female    DOB: [birth year], 75 y.o.   MRN: [id number]  Patient Care Team: Baruch Gouty, FNP as PCP - General (Family Medicine)   Chief Complaint:  New Patient (Initial Visit)   HPI: Kristina Peterson is a 75 y.o. female presenting on 07/22/2021 for New Patient (Initial Visit)   Patient presents today to establish care with new PCP.  She was formally followed by Dr. Lorne Skeens in [place], [place]. she has a medical history significant for type 2 diabetes with associated hyperlipidemia, hypertension, chronic kidney disease, morbid obesity, and coronary artery disease.  She has a history of endometrial cancer and is s/p hysterectomy.  She had a repair NSTEMI 04/2020, treated at [facility], [facility] revealed mild CAD, no culprit lesions for targeted PCI. She has been managed medically. She was last seen by her primary care provider in /07/2020 for AWV and labs.  She was supposed to be taking glipizide and metformin for diabetes but was unable to tolerate so she quit taking them.  She states she is taking her statin, Plavix, metoprolol, vitamin D, Omega 3, and folic acid.  She was on losartan but this was discontinued.  She is not on ACEi or ARB therapy.  She states she is doing pretty well overall.  No chest pain, shortness of breath, palpitations, leg swelling, dizziness, syncope, fatigue, polyuria, polyphagia, polydipsia, weakness, headaches, fatigue, weight changes, abnormal bleeding or bruising.  States she does not like to get doctors but is willing to start to achieve better health.  No labs within the last 6 months.  Health maintenance is not up-to-date.  She does see cardiology in [place], [place] and ophthalmology in [place], [place].  Records have been requested.  Relevant past medical, surgical, family, and social history reviewed and updated as indicated.  Allergies and medications reviewed and updated. Data reviewed: Chart in Epic.   Past Medical  History:  Diagnosis Date   Diabetes mellitus without complication ([place])    Hypertension     Past Surgical History:  Procedure Laterality Date   DILATION AND CURETTAGE OF UTERUS     HYSTEROSCOPY WITH D & C N/A 02/13/2015   Procedure: HYSTEROSCOPY, UTERINE CURETTAGE;  Surgeon: Florian Buff, MD;  Location: [facility];  Service: Gynecology;  Laterality: N/A;    Social History   Socioeconomic History   Marital status: Married    Spouse name: Not on file   Number of children: Not on file   Years of education: Not on file   Highest education level: Not on file  Occupational History   Not on file  Tobacco Use   Smoking status: Never   Smokeless tobacco: Never  Substance and Sexual Activity   Alcohol use: No    Alcohol/week: 0.0 standard drinks   Drug use: No   Sexual activity: Not Currently  Other Topics Concern   Not on file  Social History Narrative   Not on file   Social Determinants of Health   Financial Resource Strain: Not on file  Food Insecurity: Not on file  Transportation Needs: Not on file  Physical Activity: Not on file  Stress: Not on file  Social Connections: Not on file  Intimate Partner Violence: Not on file    Outpatient Encounter Medications as of 07/22/2021  Medication Sig   aspirin EC 81 MG tablet Take 81 mg by mouth daily.   atorvastatin (LIPITOR) 80 MG tablet Take 80 mg by mouth at  bedtime.   Cholecalciferol 50 MCG (2000 UT) CAPS Take by mouth.   clopidogrel (PLAVIX) 75 MG tablet Take 75 mg by mouth daily.   folic acid (FOLVITE) 1 MG tablet Take by mouth.   ibuprofen (ADVIL) 200 MG tablet Take by mouth.   metoprolol succinate (TOPROL-XL) 50 MG 24 hr tablet Take 1 tablet by mouth daily.   omega-3 fish oil (MAXEPA) 1000 MG CAPS capsule Take 1 capsule by mouth daily.   tirzepatide [facility]) 2.5 MG/0.5ML Pen Inject 2.5 mg into the skin once a week for 4 doses.   [START ON 08/19/2021] tirzepatide (MOUNJARO) 5 MG/0.5ML Pen Inject 5 mg into the skin once a  week for 4 doses.   [DISCONTINUED] atorvastatin (LIPITOR) 80 MG tablet Take by mouth.   [DISCONTINUED] clopidogrel (PLAVIX) 75 MG tablet Take 1 tablet by mouth daily.   [DISCONTINUED] glipiZIDE (GLUCOTROL XL) 5 MG 24 hr tablet Take by mouth.   [DISCONTINUED] losartan-hydrochlorothiazide (HYZAAR) 100-25 MG tablet Take 1 tablet by mouth daily.   [DISCONTINUED] B Complex Vitamins (VITAMIN B COMPLEX) TABS Take 1 tablet by mouth daily.   [DISCONTINUED] Cholecalciferol 25 MCG (1000 UT) tablet Take by mouth.   [DISCONTINUED] folic acid (FOLVITE) 1 MG tablet Take 1 mg by mouth daily.   [DISCONTINUED] losartan-hydrochlorothiazide (HYZAAR) 100-12.5 MG per tablet Take 1 tablet by mouth daily.   [DISCONTINUED] metoprolol succinate (TOPROL-XL) 50 MG 24 hr tablet Take 50 mg by mouth daily.   [DISCONTINUED] Omega-3 Fatty Acids (FISH OIL) 1200 MG CAPS Take 1,200 mg by mouth 1 day or 1 dose.   No facility-administered encounter medications on file as of 07/22/2021.    No Known Allergies  Review of Systems  Constitutional:  Negative for activity change, appetite change, chills, diaphoresis, fatigue, fever and unexpected weight change.  HENT: Negative.    Eyes: Negative.  Negative for photophobia and visual disturbance.  Respiratory:  Negative for cough, chest tightness and shortness of breath.   Cardiovascular:  Negative for chest pain, palpitations and leg swelling.  Gastrointestinal:  Negative for abdominal pain, blood in stool, constipation, diarrhea, nausea and vomiting.  Endocrine: Negative.  Negative for cold intolerance, heat intolerance, polydipsia, polyphagia and polyuria.  Genitourinary:  Negative for decreased urine volume, difficulty urinating, dysuria, frequency and urgency.  Musculoskeletal:  Negative for arthralgias and myalgias.  Skin: Negative.   Allergic/Immunologic: Negative.   Neurological:  Negative for dizziness, tremors, seizures, syncope, facial asymmetry, speech difficulty,  weakness, light-headedness, numbness and headaches.  Hematological: Negative.  Does not bruise/bleed easily.  Psychiatric/Behavioral:  Negative for confusion, hallucinations, sleep disturbance and suicidal ideas.   All other systems reviewed and are negative.      Objective:  BP (!) 144/76    Pulse (!) 58    Temp 97.9 F (36.6 C)    Ht $R'5\' 5"'Ls$  (1.651 m)    Wt 227 lb (103 kg)    SpO2 93%    BMI 37.77 kg/m    Wt Readings from Last 3 Encounters:  07/22/21 227 lb (103 kg)  05/07/18 225 lb 8 oz (102.3 kg)  02/20/15 230 lb (104.3 kg)    Physical Exam Vitals and nursing note reviewed.  Constitutional:      General: She is not in acute distress.    Appearance: Normal appearance. She is well-developed and well-groomed. She is obese. She is not ill-appearing, toxic-appearing or diaphoretic.  HENT:     Head: Normocephalic and atraumatic.     Jaw: There is normal jaw occlusion.  Right Ear: Hearing normal.     Left Ear: Hearing normal.     Nose: Nose normal.     Mouth/Throat:     Lips: Pink.     Mouth: Mucous membranes are moist.     Pharynx: Oropharynx is clear. Uvula midline.  Eyes:     General: Lids are normal.     Extraocular Movements: Extraocular movements intact.     Conjunctiva/sclera: Conjunctivae normal.     Pupils: Pupils are equal, round, and reactive to light.  Neck:     Thyroid: No thyroid mass, thyromegaly or thyroid tenderness.     Vascular: No carotid bruit or JVD.     Trachea: Trachea and phonation normal.  Cardiovascular:     Rate and Rhythm: Normal rate and regular rhythm.     Chest Wall: PMI is not displaced.     Pulses: Normal pulses.     Heart sounds: Normal heart sounds. No murmur heard.   No friction rub. No gallop.  Pulmonary:     Effort: Pulmonary effort is normal. No respiratory distress.     Breath sounds: Normal breath sounds. No wheezing.  Abdominal:     General: Bowel sounds are normal. There is no distension or abdominal bruit.     Palpations:  Abdomen is soft. There is no hepatomegaly or splenomegaly.     Tenderness: There is no abdominal tenderness. There is no right CVA tenderness or left CVA tenderness.     Hernia: No hernia is present.  Musculoskeletal:        General: Normal range of motion.     Cervical back: Normal range of motion and neck supple.     Right lower leg: No edema.     Left lower leg: No edema.  Lymphadenopathy:     Cervical: No cervical adenopathy.  Skin:    General: Skin is warm and dry.     Capillary Refill: Capillary refill takes less than 2 seconds.     Coloration: Skin is not cyanotic, jaundiced or pale.     Findings: No rash.  Neurological:     General: No focal deficit present.     Mental Status: She is alert and oriented to person, place, and time.     Cranial Nerves: No cranial nerve deficit.     Sensory: Sensation is intact. No sensory deficit.     Motor: Motor function is intact. No weakness.     Coordination: Coordination is intact. Coordination normal.     Gait: Gait is intact. Gait normal.     Deep Tendon Reflexes: Reflexes are normal and symmetric. Reflexes normal.  Psychiatric:        Attention and Perception: Attention and perception normal.        Mood and Affect: Mood and affect normal.        Speech: Speech normal.        Behavior: Behavior normal. Behavior is cooperative.        Thought Content: Thought content normal.        Cognition and Memory: Cognition and memory normal.        Judgment: Judgment normal.    Results for orders placed or performed in visit on 07/22/21  Bayer DCA Hb A1c Waived  Result Value Ref Range   HB A1C (BAYER DCA - WAIVED) 8.3 (H) 4.8 - 5.6 %       Pertinent labs & imaging results that were available during my care of the patient were reviewed by me  and considered in my medical decision making.  Assessment & Plan:  [place] was seen today for new patient (initial visit).  Diagnoses and all orders for this visit:  Type 2 diabetes mellitus with  other specified complication, without long-term current use of insulin ([place]) A1C 8.3 in office.  Long discussion about medications.  Patient has been intolerant to metformin and glipizide in the past.  Due to underlying cardiovascular and renal disease, will initiate Mounjaro as prescribed.  Patient aware to follow-up in 1 month for reevaluation.  Diet and exercise encouraged.  Continue to monitor blood sugars and report any persistent high or low readings. -     CBC with Differential/Platelet -     CMP14+EGFR -     Lipid panel -     Microalbumin / creatinine urine ratio -     Thyroid Panel With TSH -     Bayer DCA Hb A1c Waived -     tirzepatide (MOUNJARO) 2.5 MG/0.5ML Pen; Inject 2.5 mg into the skin once a week for 4 doses. -     tirzepatide (MOUNJARO) 5 MG/0.5ML Pen; Inject 5 mg into the skin once a week for 4 doses.  Hypertension associated with diabetes ([place]) Well-controlled on current regimen but not on ACEi or ARB therapy.  If renal function acceptable, will initiate low-dose lisinopril. -     CBC with Differential/Platelet -     CMP14+EGFR -     Lipid panel -     Microalbumin / creatinine urine ratio -     Thyroid Panel With TSH -     Bayer DCA Hb A1c Waived  Hyperlipidemia associated with type 2 diabetes mellitus ([place]) Diet and exercise encouraged.  Labs pending.  Continue statin therapy and omega-3. -     CBC with Differential/Platelet -     CMP14+EGFR -     Lipid panel -     Microalbumin / creatinine urine ratio -     Thyroid Panel With TSH -     Bayer DCA Hb A1c Waived  Type 2 diabetes mellitus with stage 4 chronic kidney disease, without long-term current use of insulin ([place]) Labs pending.  Will initiate ACEi or ARB therapy if able. -     CBC with Differential/Platelet -     CMP14+EGFR -     Lipid panel -     Microalbumin / creatinine urine ratio -     Thyroid Panel With TSH -     Bayer DCA Hb A1c Waived  Morbid obesity ([facility]) Diet exercise encouraged.  Labs  pending. -     CBC with Differential/Platelet -     CMP14+EGFR -     Lipid panel -     Microalbumin / creatinine urine ratio -     Thyroid Panel With TSH -     Bayer DCA Hb A1c Waived  Coronary artery disease involving native coronary artery of native heart without angina pectoris Management of symptoms.  Managed medically.  Followed by cardiology in [place], [place].    Continue all other maintenance medications.  Follow up plan: Return in about 1 month (around 08/22/2021), or if symptoms worsen or fail to improve, for DM.   Continue healthy lifestyle choices, including diet (rich in fruits, vegetables, and lean proteins, and low in salt and simple carbohydrates) and exercise (at least 30 minutes of moderate physical activity daily).  Educational handout given for DM  The above assessment and management plan was discussed with the patient. The patient  verbalized understanding of and has agreed to the management plan. Patient is aware to call the clinic if they develop any new symptoms or if symptoms persist or worsen. Patient is aware when to return to the clinic for a follow-up visit. Patient educated on when it is appropriate to go to the emergency department.  ° °Michelle Hoyle Barkdull, FNP-C °[facility] °[phone] ° ° °

## 2021-07-22 NOTE — Patient Instructions (Signed)

## 2021-07-23 LAB — CMP14+EGFR
ALT: 18 IU/L (ref 0–32)
AST: 18 IU/L (ref 0–40)
Albumin/Globulin Ratio: 1.7 (ref 1.2–2.2)
Albumin: 4.5 g/dL (ref 3.7–4.7)
Alkaline Phosphatase: 94 IU/L (ref 44–121)
BUN/Creatinine Ratio: 16 (ref 12–28)
BUN: 19 mg/dL (ref 8–27)
Bilirubin Total: 0.6 mg/dL (ref 0.0–1.2)
CO2: 26 mmol/L (ref 20–29)
Calcium: 10.2 mg/dL (ref 8.7–10.3)
Chloride: 100 mmol/L (ref 96–106)
Creatinine, Ser: 1.22 mg/dL — ABNORMAL HIGH (ref 0.57–1.00)
Globulin, Total: 2.7 g/dL (ref 1.5–4.5)
Glucose: 202 mg/dL — ABNORMAL HIGH (ref 70–99)
Potassium: 4.6 mmol/L (ref 3.5–5.2)
Sodium: 141 mmol/L (ref 134–144)
Total Protein: 7.2 g/dL (ref 6.0–8.5)
eGFR: 46 mL/min/{1.73_m2} — ABNORMAL LOW (ref >59)

## 2021-07-23 LAB — CBC WITH DIFFERENTIAL/PLATELET
Basophils Absolute: 0 10*3/uL (ref 0.0–0.2)
Basos: 1 %
EOS (ABSOLUTE): 0.1 10*3/uL (ref 0.0–0.4)
Eos: 2 %
Hematocrit: 38.8 % (ref 34.0–46.6)
Hemoglobin: 13.5 g/dL (ref 11.1–15.9)
Immature Grans (Abs): 0 10*3/uL (ref 0.0–0.1)
Immature Granulocytes: 0 %
Lymphocytes Absolute: 1.8 10*3/uL (ref 0.7–3.1)
Lymphs: 30 %
MCH: 31.3 pg (ref 26.6–33.0)
MCHC: 34.8 g/dL (ref 31.5–35.7)
MCV: 90 fL (ref 79–97)
Monocytes Absolute: 0.4 10*3/uL (ref 0.1–0.9)
Monocytes: 7 %
Neutrophils Absolute: 3.6 10*3/uL (ref 1.4–7.0)
Neutrophils: 60 %
Platelets: 210 10*3/uL (ref 150–450)
RBC: 4.32 x10E6/uL (ref 3.77–5.28)
RDW: 12.9 % (ref 11.7–15.4)
WBC: 5.9 10*3/uL (ref 3.4–10.8)

## 2021-07-23 LAB — LIPID PANEL
Chol/HDL Ratio: 2.5 ratio (ref 0.0–4.4)
Cholesterol, Total: 126 mg/dL (ref 100–199)
HDL: 50 mg/dL (ref >39)
LDL Chol Calc (NIH): 50 mg/dL (ref 0–99)
Triglycerides: 151 mg/dL — ABNORMAL HIGH (ref 0–149)
VLDL Cholesterol Cal: 26 mg/dL (ref 5–40)

## 2021-07-23 LAB — MICROALBUMIN / CREATININE URINE RATIO
Creatinine, Urine: 148.8 mg/dL
Microalb/Creat Ratio: 3 mg/g creat (ref 0–29)
Microalbumin, Urine: 4.2 ug/mL

## 2021-07-23 LAB — THYROID PANEL WITH TSH
Free Thyroxine Index: 3 (ref 1.2–4.9)
T3 Uptake Ratio: 31 % (ref 24–39)
T4, Total: 9.6 ug/dL (ref 4.5–12.0)
TSH: 4.58 u[IU]/mL — ABNORMAL HIGH (ref 0.450–4.500)

## 2021-07-23 MED ORDER — LISINOPRIL 5 MG PO TABS: 5.0000 mg | ORAL_TABLET | Freq: Every day | ORAL | 3 refills | Status: DC

## 2021-07-23 NOTE — Addendum Note (Signed)
Addended by: Baruch Gouty on: 07/23/2021 08:01 AM   Modules accepted: Orders

## 2021-08-02 ENCOUNTER — Other Ambulatory Visit: Payer: Self-pay | Admitting: Family Medicine

## 2021-08-03 MED ORDER — ATORVASTATIN CALCIUM 80 MG PO TABS: 80.0000 mg | ORAL_TABLET | Freq: Every day | ORAL | 0 refills | Status: DC

## 2021-08-04 ENCOUNTER — Telehealth: Payer: Self-pay | Admitting: Family Medicine

## 2021-08-05 NOTE — Telephone Encounter (Signed)
Insurance is requesting alternative for mounjaro. They are requesting trulicity, ozempic, rybelsus, or victoza. Please review

## 2021-08-05 NOTE — Telephone Encounter (Signed)
Pts daughter called to let us know that she spoke with pts insurance about [facility] and says they have sent [place] request for [place] for Mounjaro 2.5 and also for the 5.0.Marland Kitchen wants someone to call her with update on this.  Trisha Mangle [phone] (pt is with her)

## 2021-08-06 ENCOUNTER — Other Ambulatory Visit: Payer: Self-pay | Admitting: Family Medicine

## 2021-08-06 DIAGNOSIS — E1169 Type 2 diabetes mellitus with other specified complication: Secondary | ICD-10-CM

## 2021-08-06 MED ORDER — OZEMPIC (0.25 OR 0.5 MG/DOSE) 2 MG/1.5ML ~~LOC~~ SOPN: 0.2500 mg | PEN_INJECTOR | SUBCUTANEOUS | 1 refills | Status: DC

## 2021-08-06 NOTE — Telephone Encounter (Signed)
Patient aware and verbalizes understanding. 

## 2021-08-24 ENCOUNTER — Ambulatory Visit (INDEPENDENT_AMBULATORY_CARE_PROVIDER_SITE_OTHER): Payer: Medicare HMO

## 2021-08-24 ENCOUNTER — Ambulatory Visit (INDEPENDENT_AMBULATORY_CARE_PROVIDER_SITE_OTHER): Payer: Medicare HMO | Admitting: Family Medicine

## 2021-08-24 ENCOUNTER — Encounter: Payer: Self-pay | Admitting: Family Medicine

## 2021-08-24 VITALS — BP 113/68 | HR 70 | Temp 97.5°F | Ht 65.0 in | Wt 226.0 lb

## 2021-08-24 DIAGNOSIS — E1159 Type 2 diabetes mellitus with other circulatory complications: Secondary | ICD-10-CM | POA: Diagnosis not present

## 2021-08-24 DIAGNOSIS — Z13820 Encounter for screening for osteoporosis: Secondary | ICD-10-CM | POA: Diagnosis not present

## 2021-08-24 DIAGNOSIS — E1169 Type 2 diabetes mellitus with other specified complication: Secondary | ICD-10-CM

## 2021-08-24 DIAGNOSIS — Z780 Asymptomatic menopausal state: Secondary | ICD-10-CM

## 2021-08-24 DIAGNOSIS — I152 Hypertension secondary to endocrine disorders: Secondary | ICD-10-CM

## 2021-08-24 MED ORDER — LISINOPRIL 10 MG PO TABS: 10.0000 mg | ORAL_TABLET | Freq: Every day | ORAL | 3 refills | Status: DC

## 2021-08-24 NOTE — Patient Instructions (Signed)

## 2021-08-24 NOTE — Progress Notes (Signed)
Subjective:  Patient ID: Kristina Peterson, female    DOB: 28-Mar-1946, 76 y.o.   MRN: [id number]  Patient Care Team: Baruch Gouty, FNP as PCP - General (Family Medicine)   Chief Complaint:  Diabetes   HPI: Kristina Peterson is a 76 y.o. female presenting on 08/24/2021 for Diabetes   Pt presents today for follow up after initiation of Ozempic for T2DM. She has been tolerating fairly, states she has some reflux and nausea but this seems to be improving. She has been seen by cardiology and her lisinopril dose was increased to 10 mg. She has been tolerating this change well.   Diabetes She presents for her follow-up diabetic visit. She has type 2 diabetes mellitus. Her disease course has been stable. There are no hypoglycemic associated symptoms. Pertinent negatives for hypoglycemia include no confusion, dizziness, headaches, seizures, speech difficulty or tremors. There are no diabetic associated symptoms. Pertinent negatives for diabetes include no blurred vision, no chest pain, no fatigue, no foot paresthesias, no foot ulcerations, no polydipsia, no polyphagia, no polyuria, no visual change, no weakness and no weight loss. There are no hypoglycemic complications. Risk factors for coronary artery disease include dyslipidemia, diabetes mellitus, obesity and post-menopausal. Current diabetic treatments: ozempic. She is compliant with treatment all of the time. She is following a diabetic diet. An ACE inhibitor/angiotensin II receptor blocker is being taken. She does not see a podiatrist.Eye exam is current.      Relevant past medical, surgical, family, and social history reviewed and updated as indicated.  Allergies and medications reviewed and updated. Data reviewed: Chart in Epic.   Past Medical History:  Diagnosis Date   Diabetes mellitus without complication ([place])    Hypertension     Past Surgical History:  Procedure Laterality Date   DILATION AND CURETTAGE OF UTERUS     HYSTEROSCOPY  WITH D & C N/A 02/13/2015   Procedure: HYSTEROSCOPY, UTERINE CURETTAGE;  Surgeon: Florian Buff, MD;  Location: [facility];  Service: Gynecology;  Laterality: N/A;    Social History   Socioeconomic History   Marital status: Married    Spouse name: Not on file   Number of children: Not on file   Years of education: Not on file   Highest education level: Not on file  Occupational History   Not on file  Tobacco Use   Smoking status: Never   Smokeless tobacco: Never  Substance and Sexual Activity   Alcohol use: No    Alcohol/week: 0.0 standard drinks   Drug use: No   Sexual activity: Not Currently  Other Topics Concern   Not on file  Social History Narrative   Not on file   Social Determinants of Health   Financial Resource Strain: Not on file  Food Insecurity: Not on file  Transportation Needs: Not on file  Physical Activity: Not on file  Stress: Not on file  Social Connections: Not on file  Intimate Partner Violence: Not on file    Outpatient Encounter Medications as of 08/24/2021  Medication Sig   aspirin EC 81 MG tablet Take 81 mg by mouth daily.   atorvastatin (LIPITOR) 80 MG tablet Take 1 tablet (80 mg total) by mouth at bedtime.   Cholecalciferol 50 MCG (2000 UT) CAPS Take by mouth.   folic acid (FOLVITE) 1 MG tablet Take by mouth.   ibuprofen (ADVIL) 200 MG tablet Take by mouth.   lisinopril (ZESTRIL) 10 MG tablet Take 1 tablet (10 mg total)  by mouth daily.   metoprolol succinate (TOPROL-XL) 50 MG 24 hr tablet Take 1 tablet by mouth daily.   omega-3 fish oil (MAXEPA) 1000 MG CAPS capsule Take 1 capsule by mouth daily.   Semaglutide,0.25 or 0.5MG/DOS, (OZEMPIC, 0.25 OR 0.5 MG/DOSE,) 2 MG/1.5ML SOPN Inject 0.25 mg into the skin once a week. 0.25 mg weekly for 4 weeks then 0.5 mg weekly for 4 weeks   [DISCONTINUED] lisinopril (ZESTRIL) 5 MG tablet Take 1 tablet (5 mg total) by mouth daily. (Patient taking differently: Take 10 mg by mouth daily.)   [DISCONTINUED]  clopidogrel (PLAVIX) 75 MG tablet Take 75 mg by mouth daily.   No facility-administered encounter medications on file as of 08/24/2021.    No Known Allergies  Review of Systems  Constitutional:  Negative for activity change, appetite change, chills, diaphoresis, fatigue, fever, unexpected weight change and weight loss.  HENT: Negative.    Eyes: Negative.  Negative for blurred vision.  Respiratory:  Negative for cough, chest tightness and shortness of breath.   Cardiovascular:  Negative for chest pain, palpitations and leg swelling.  Gastrointestinal:  Positive for nausea. Negative for abdominal distention, abdominal pain, anal bleeding, blood in stool, constipation, diarrhea, rectal pain and vomiting.       Reflux  Endocrine: Negative.  Negative for polydipsia, polyphagia and polyuria.  Genitourinary:  Negative for decreased urine volume, difficulty urinating, dysuria, frequency and urgency.  Musculoskeletal:  Negative for arthralgias and myalgias.  Skin: Negative.   Allergic/Immunologic: Negative.   Neurological:  Negative for dizziness, tremors, seizures, syncope, facial asymmetry, speech difficulty, weakness, light-headedness, numbness and headaches.  Hematological: Negative.   Psychiatric/Behavioral:  Negative for confusion, hallucinations, sleep disturbance and suicidal ideas.   All other systems reviewed and are negative.      Objective:  BP 113/68    Pulse 70    Temp (!) 97.5 F (36.4 C) (Temporal)    Ht 5' 5"  (1.651 m)    Wt 226 lb (102.5 kg)    SpO2 91%    BMI 37.61 kg/m    Wt Readings from Last 3 Encounters:  08/24/21 226 lb (102.5 kg)  07/22/21 227 lb (103 kg)  05/07/18 225 lb 8 oz (102.3 kg)    Physical Exam Vitals and nursing note reviewed.  Constitutional:      General: She is not in acute distress.    Appearance: Normal appearance. She is well-developed and well-groomed. She is obese. She is not ill-appearing, toxic-appearing or diaphoretic.  HENT:     Head:  Normocephalic and atraumatic.     Jaw: There is normal jaw occlusion.     Right Ear: Hearing normal.     Left Ear: Hearing normal.     Nose: Nose normal.     Mouth/Throat:     Lips: Pink.     Mouth: Mucous membranes are moist.     Pharynx: Oropharynx is clear. Uvula midline.  Eyes:     General: Lids are normal.     Extraocular Movements: Extraocular movements intact.     Conjunctiva/sclera: Conjunctivae normal.     Pupils: Pupils are equal, Peterson, and reactive to light.  Neck:     Thyroid: No thyroid mass, thyromegaly or thyroid tenderness.     Vascular: No carotid bruit or JVD.     Trachea: Trachea and phonation normal.  Cardiovascular:     Rate and Rhythm: Normal rate and regular rhythm.     Chest Wall: PMI is not displaced.  Pulses: Normal pulses.          Dorsalis pedis pulses are 2+ on the right side and 2+ on the left side.       Posterior tibial pulses are 2+ on the right side and 2+ on the left side.     Heart sounds: Normal heart sounds. No murmur heard.   No friction rub. No gallop.  Pulmonary:     Effort: Pulmonary effort is normal. No respiratory distress.     Breath sounds: Normal breath sounds. No wheezing.  Abdominal:     General: Bowel sounds are normal. There is no distension or abdominal bruit.     Palpations: Abdomen is soft. There is no hepatomegaly or splenomegaly.     Tenderness: There is no abdominal tenderness. There is no right CVA tenderness or left CVA tenderness.     Hernia: No hernia is present.  Musculoskeletal:        General: Normal range of motion.     Cervical back: Normal range of motion and neck supple.     Right lower leg: No edema.     Left lower leg: No edema.  Feet:     Right foot:     Protective Sensation: 10 sites tested.  10 sites sensed.     Skin integrity: Skin integrity normal.     Left foot:     Protective Sensation: 10 sites tested.  10 sites sensed.     Skin integrity: Skin integrity normal.  Lymphadenopathy:      Cervical: No cervical adenopathy.  Skin:    General: Skin is warm and dry.     Capillary Refill: Capillary refill takes less than 2 seconds.     Coloration: Skin is not cyanotic, jaundiced or pale.     Findings: No rash.  Neurological:     General: No focal deficit present.     Mental Status: She is alert and oriented to person, place, and time.     Sensory: Sensation is intact.     Motor: Motor function is intact.     Coordination: Coordination is intact.     Gait: Gait is intact.     Deep Tendon Reflexes: Reflexes are normal and symmetric.  Psychiatric:        Attention and Perception: Attention and perception normal.        Mood and Affect: Mood and affect normal.        Speech: Speech normal.        Behavior: Behavior normal. Behavior is cooperative.        Thought Content: Thought content normal.        Cognition and Memory: Cognition and memory normal.        Judgment: Judgment normal.    Results for orders placed or performed in visit on 07/22/21  CBC with Differential/Platelet  Result Value Ref Range   WBC 5.9 3.4 - 10.8 x10E3/uL   RBC 4.32 3.77 - 5.28 x10E6/uL   Hemoglobin 13.5 11.1 - 15.9 g/dL   Hematocrit 38.8 34.0 - 46.6 %   MCV 90 79 - 97 fL   MCH 31.3 26.6 - 33.0 pg   MCHC 34.8 31.5 - 35.7 g/dL   RDW 12.9 11.7 - 15.4 %   Platelets 210 150 - 450 x10E3/uL   Neutrophils 60 Not Estab. %   Lymphs 30 Not Estab. %   Monocytes 7 Not Estab. %   Eos 2 Not Estab. %   Basos 1 Not Estab. %  Neutrophils Absolute 3.6 1.4 - 7.0 x10E3/uL   Lymphocytes Absolute 1.8 0.7 - 3.1 x10E3/uL   Monocytes Absolute 0.4 0.1 - 0.9 x10E3/uL   EOS (ABSOLUTE) 0.1 0.0 - 0.4 x10E3/uL   Basophils Absolute 0.0 0.0 - 0.2 x10E3/uL   Immature Granulocytes 0 Not Estab. %   Immature Grans (Abs) 0.0 0.0 - 0.1 x10E3/uL  CMP14+EGFR  Result Value Ref Range   Glucose 202 (H) 70 - 99 mg/dL   BUN 19 8 - 27 mg/dL   Creatinine, Ser 1.22 (H) 0.57 - 1.00 mg/dL   eGFR 46 (L) >59 mL/min/1.73    BUN/Creatinine Ratio 16 12 - 28   Sodium 141 134 - 144 mmol/L   Potassium 4.6 3.5 - 5.2 mmol/L   Chloride 100 96 - 106 mmol/L   CO2 26 20 - 29 mmol/L   Calcium 10.2 8.7 - 10.3 mg/dL   Total Protein 7.2 6.0 - 8.5 g/dL   Albumin 4.5 3.7 - 4.7 g/dL   Globulin, Total 2.7 1.5 - 4.5 g/dL   Albumin/Globulin Ratio 1.7 1.2 - 2.2   Bilirubin Total 0.6 0.0 - 1.2 mg/dL   Alkaline Phosphatase 94 44 - 121 IU/L   AST 18 0 - 40 IU/L   ALT 18 0 - 32 IU/L  Lipid panel  Result Value Ref Range   Cholesterol, Total 126 100 - 199 mg/dL   Triglycerides 151 (H) 0 - 149 mg/dL   HDL 50 >39 mg/dL   VLDL Cholesterol Cal 26 5 - 40 mg/dL   LDL Chol Calc (NIH) 50 0 - 99 mg/dL   Chol/HDL Ratio 2.5 0.0 - 4.4 ratio  Microalbumin / creatinine urine ratio  Result Value Ref Range   Creatinine, Urine 148.8 Not Estab. mg/dL   Microalbumin, Urine 4.2 Not Estab. ug/mL   Microalb/Creat Ratio 3 0 - 29 mg/g creat  Thyroid Panel With TSH  Result Value Ref Range   TSH 4.580 (H) 0.450 - 4.500 uIU/mL   T4, Total 9.6 4.5 - 12.0 ug/dL   T3 Uptake Ratio 31 24 - 39 %   Free Thyroxine Index 3.0 1.2 - 4.9  Bayer DCA Hb A1c Waived  Result Value Ref Range   HB A1C (BAYER DCA - WAIVED) 8.3 (H) 4.8 - 5.6 %       Pertinent labs & imaging results that were available during my care of the patient were reviewed by me and considered in my medical decision making.  Assessment & Plan:  Kristina Peterson was seen today for diabetes.  Diagnoses and all orders for this visit:  Type 2 diabetes mellitus with other specified complication, without long-term current use of insulin ([place]) Tolerating Ozempic fairly well. Will continue. Repeat CMP today.  -     CMP14+EGFR  Encounter for osteoporosis screening in asymptomatic postmenopausal patient DEXA ordered. Calcium and Vit D repletion therapy discussed.  -     DG [facility] DEXA; Future  Hypertension associated with diabetes ([place]) Lisinopril dose increased by cardiology, needs refill. Tolerating well.   -     lisinopril (ZESTRIL) 10 MG tablet; Take 1 tablet (10 mg total) by mouth daily.     Continue all other maintenance medications.  Follow up plan: Return if symptoms worsen or fail to improve.   Continue healthy lifestyle choices, including diet (rich in fruits, vegetables, and lean proteins, and low in salt and simple carbohydrates) and exercise (at least 30 minutes of moderate physical activity daily).  Educational handout given for DM  The above assessment  and management plan was discussed with the patient. The patient verbalized understanding of and has agreed to the management plan. Patient is aware to call the clinic if they develop any new symptoms or if symptoms persist or worsen. Patient is aware when to return to the clinic for a follow-up visit. Patient educated on when it is appropriate to go to the emergency department.   Monia Pouch, FNP-C [facility] [phone]

## 2021-08-25 LAB — CMP14+EGFR
ALT: 18 IU/L (ref 0–32)
AST: 17 IU/L (ref 0–40)
Albumin/Globulin Ratio: 2 (ref 1.2–2.2)
Albumin: 4.5 g/dL (ref 3.7–4.7)
Alkaline Phosphatase: 96 IU/L (ref 44–121)
BUN/Creatinine Ratio: 13 (ref 12–28)
BUN: 15 mg/dL (ref 8–27)
Bilirubin Total: 0.5 mg/dL (ref 0.0–1.2)
CO2: 22 mmol/L (ref 20–29)
Calcium: 10.5 mg/dL — ABNORMAL HIGH (ref 8.7–10.3)
Chloride: 101 mmol/L (ref 96–106)
Creatinine, Ser: 1.17 mg/dL — ABNORMAL HIGH (ref 0.57–1.00)
Globulin, Total: 2.3 g/dL (ref 1.5–4.5)
Glucose: 159 mg/dL — ABNORMAL HIGH (ref 70–99)
Potassium: 4.7 mmol/L (ref 3.5–5.2)
Sodium: 137 mmol/L (ref 134–144)
Total Protein: 6.8 g/dL (ref 6.0–8.5)
eGFR: 49 mL/min/{1.73_m2} — ABNORMAL LOW (ref >59)

## 2021-08-31 ENCOUNTER — Telehealth: Payer: Self-pay | Admitting: Family Medicine

## 2021-08-31 NOTE — Telephone Encounter (Signed)
°  Left message for patient to call back and schedule Medicare Annual Wellness Visit (AWV) to be completed by video or phone.  No hx of AWV eligible for AWVI as of 08/26/2011 per [employer]  Please schedule at anytime with [place] --- Karle Starch  45 Minutes appointment   Any questions, please call me at [phone]

## 2021-09-27 ENCOUNTER — Telehealth: Payer: Self-pay | Admitting: Family Medicine

## 2021-09-27 NOTE — Telephone Encounter (Signed)
?  Left message for patient to call back and schedule Medicare Annual Wellness Visit (AWV) to be completed by video or phone. ? ?No hx of AWV eligible for AWVI per [employer] as of 08/26/2011  ? ?Please schedule at anytime with [place] --- Karle Starch ? ?81 Minutes appointment  ? ?Any questions, please call me at [phone]   ?

## 2021-10-22 ENCOUNTER — Encounter: Payer: Self-pay | Admitting: Family Medicine

## 2021-10-22 ENCOUNTER — Ambulatory Visit (INDEPENDENT_AMBULATORY_CARE_PROVIDER_SITE_OTHER): Payer: Medicare HMO | Admitting: Family Medicine

## 2021-10-22 VITALS — BP 116/69 | HR 85 | Temp 97.1°F | Ht 65.0 in | Wt 225.0 lb

## 2021-10-22 DIAGNOSIS — E1169 Type 2 diabetes mellitus with other specified complication: Secondary | ICD-10-CM | POA: Diagnosis not present

## 2021-10-22 DIAGNOSIS — I152 Hypertension secondary to endocrine disorders: Secondary | ICD-10-CM

## 2021-10-22 DIAGNOSIS — E1159 Type 2 diabetes mellitus with other circulatory complications: Secondary | ICD-10-CM | POA: Diagnosis not present

## 2021-10-22 DIAGNOSIS — H02839 Dermatochalasis of unspecified eye, unspecified eyelid: Secondary | ICD-10-CM

## 2021-10-22 DIAGNOSIS — H539 Unspecified visual disturbance: Secondary | ICD-10-CM

## 2021-10-22 LAB — BAYER DCA HB A1C WAIVED: HB A1C (BAYER DCA - WAIVED): 7 % — ABNORMAL HIGH (ref 4.8–5.6)

## 2021-10-22 MED ORDER — OZEMPIC (0.25 OR 0.5 MG/DOSE) 2 MG/1.5ML ~~LOC~~ SOPN: 0.5000 mg | PEN_INJECTOR | SUBCUTANEOUS | 6 refills | Status: DC

## 2021-10-22 NOTE — Progress Notes (Signed)
?  ? ?Subjective:  ?Patient ID: Kristina Peterson, female    DOB: June 13, 1946, 76 y.o.   MRN: [id number] ? ?Patient Care Team: ?Baruch Gouty, FNP as PCP - General (Family Medicine)  ? ?Chief Complaint:  Diabetes and Hypertension ? ? ?HPI: ?Kristina Peterson is a 76 y.o. female presenting on 10/22/2021 for Diabetes and Hypertension ? ? ?Diabetes ?She presents for her follow-up diabetic visit. She has type 2 diabetes mellitus. Her disease course has been improving. Pertinent negatives for hypoglycemia include no confusion, dizziness, headaches, hunger, mood changes, nervousness/anxiousness, pallor, seizures, sleepiness, speech difficulty, sweats or tremors. Associated symptoms include blurred vision, visual change and weight loss (minimal). Pertinent negatives for diabetes include no chest pain, no fatigue, no foot paresthesias, no foot ulcerations, no polydipsia, no polyphagia, no polyuria and no weakness. There are no hypoglycemic complications. Diabetic complications include heart disease. Risk factors for coronary artery disease include diabetes mellitus, dyslipidemia, obesity and hypertension. Current diabetic treatments: Ozempic. She is compliant with treatment all of the time. An ACE inhibitor/angiotensin II receptor blocker is being taken. She sees a podiatrist.Eye exam is current.  ?Hypertension ?This is a chronic problem. The current episode started more than 1 year ago. The problem is controlled. Associated symptoms include blurred vision. Pertinent negatives include no anxiety, chest pain, headaches, malaise/fatigue, neck pain, orthopnea, palpitations, peripheral edema, PND, shortness of breath or sweats. Risk factors for coronary artery disease include diabetes mellitus, dyslipidemia and obesity. Past treatments include ACE inhibitors. The current treatment provides significant improvement. There are no compliance problems.   ?Visual disturbance ?Hooded upper eyelid ?Pt was seen at [facility] in  [place], [place] and underwent cataract surgery. She had continued visual disturbances post procedure and was told by the eye center it was due to her hooded and heavy upper eyelids. She was told she would need a surgery to correct this and to improve her vision. She has not followed up with surgery for evaluation.   ? ? ?Relevant past medical, surgical, family, and social history reviewed and updated as indicated.  ?Allergies and medications reviewed and updated. Data reviewed: Chart in Epic. ? ? ?Past Medical History:  ?Diagnosis Date  ? Diabetes mellitus without complication ([place])   ? Hypertension   ? ? ?Past Surgical History:  ?Procedure Laterality Date  ? DILATION AND CURETTAGE OF UTERUS    ? HYSTEROSCOPY WITH D & C N/A 02/13/2015  ? Procedure: HYSTEROSCOPY, UTERINE CURETTAGE;  Surgeon: Florian Buff, MD;  Location: [facility];  Service: Gynecology;  Laterality: N/A;  ? ? ?Social History  ? ?Socioeconomic History  ? Marital status: Married  ?  Spouse name: Not on file  ? Number of children: Not on file  ? Years of education: Not on file  ? Highest education level: Not on file  ?Occupational History  ? Not on file  ?Tobacco Use  ? Smoking status: Never  ? Smokeless tobacco: Never  ?Substance and Sexual Activity  ? Alcohol use: No  ?  Alcohol/week: 0.0 standard drinks  ? Drug use: No  ? Sexual activity: Not Currently  ?Other Topics Concern  ? Not on file  ?Social History Narrative  ? Not on file  ? ?Social Determinants of Health  ? ?Financial Resource Strain: Not on file  ?Food Insecurity: Not on file  ?Transportation Needs: Not on file  ?Physical Activity: Not on file  ?Stress: Not on file  ?Social Connections: Not on file  ?Intimate Partner Violence: Not on file  ? ? ?  Outpatient Encounter Medications as of 10/22/2021  ?Medication Sig  ? aspirin EC 81 MG tablet Take 81 mg by mouth daily.  ? atorvastatin (LIPITOR) 80 MG tablet Take 1 tablet (80 mg total) by mouth at bedtime.  ? Cholecalciferol 50 MCG (2000 UT) CAPS  Take by mouth.  ? folic acid (FOLVITE) 1 MG tablet Take by mouth.  ? ibuprofen (ADVIL) 200 MG tablet Take by mouth.  ? lisinopril (ZESTRIL) 10 MG tablet Take 1 tablet (10 mg total) by mouth daily.  ? metoprolol succinate (TOPROL-XL) 50 MG 24 hr tablet Take 1 tablet by mouth daily.  ? omega-3 fish oil (MAXEPA) 1000 MG CAPS capsule Take 1 capsule by mouth daily.  ? [DISCONTINUED] Semaglutide,0.25 or 0.'5MG'$ /DOS, (OZEMPIC, 0.25 OR 0.5 MG/DOSE,) 2 MG/1.5ML SOPN Inject 0.25 mg into the skin once a week. 0.25 mg weekly for 4 weeks then 0.5 mg weekly for 4 weeks  ? Semaglutide,0.25 or 0.'5MG'$ /DOS, (OZEMPIC, 0.25 OR 0.5 MG/DOSE,) 2 MG/1.5ML SOPN Inject 0.5 mg into the skin once a week.  ? ?No facility-administered encounter medications on file as of 10/22/2021.  ? ? ?No Known Allergies ? ?Review of Systems  ?Constitutional:  Positive for weight loss (minimal). Negative for activity change, appetite change, chills, diaphoresis, fatigue, fever, malaise/fatigue and unexpected weight change.  ?HENT: Negative.    ?Eyes:  Positive for blurred vision, photophobia and visual disturbance. Negative for pain, discharge, redness and itching.  ?Respiratory:  Negative for cough, chest tightness and shortness of breath.   ?Cardiovascular:  Negative for chest pain, palpitations, orthopnea, leg swelling and PND.  ?Gastrointestinal:  Positive for nausea (mild after Ozempic dosing). Negative for abdominal pain, blood in stool, constipation, diarrhea and vomiting.  ?Endocrine: Negative.  Negative for polydipsia, polyphagia and polyuria.  ?Genitourinary:  Negative for decreased urine volume, difficulty urinating, dysuria, frequency and urgency.  ?Musculoskeletal:  Negative for arthralgias, myalgias and neck pain.  ?Skin: Negative.  Negative for pallor.  ?Allergic/Immunologic: Negative.   ?Neurological:  Negative for dizziness, tremors, seizures, syncope, facial asymmetry, speech difficulty, weakness, light-headedness, numbness and headaches.   ?Hematological: Negative.   ?Psychiatric/Behavioral:  Negative for confusion, hallucinations, sleep disturbance and suicidal ideas. The patient is not nervous/anxious.   ?All other systems reviewed and are negative. ? ?   ? ?Objective:  ?BP 116/69   Pulse 85   Temp (!) 97.1 ?F (36.2 ?C) (Temporal)   Ht '5\' 5"'$  (1.651 m)   Wt 225 lb (102.1 kg)   SpO2 97%   BMI 37.44 kg/m?   ? ?Wt Readings from Last 3 Encounters:  ?10/22/21 225 lb (102.1 kg)  ?08/24/21 226 lb (102.5 kg)  ?07/22/21 227 lb (103 kg)  ? ? ?Physical Exam ?Vitals and nursing note reviewed.  ?Constitutional:   ?   General: She is not in acute distress. ?   Appearance: Normal appearance. She is well-developed and well-groomed. She is not ill-appearing, toxic-appearing or diaphoretic.  ?HENT:  ?   Head: Normocephalic and atraumatic.  ?   Jaw: There is normal jaw occlusion.  ?   Right Ear: Hearing normal.  ?   Left Ear: Hearing normal.  ?   Nose: Nose normal.  ?   Mouth/Throat:  ?   Lips: Pink.  ?   Mouth: Mucous membranes are moist.  ?   Pharynx: Oropharynx is clear. Uvula midline.  ?Eyes:  ?   General: Lids are normal.  ?   Conjunctiva/sclera: Conjunctivae normal.  ?   Pupils: Pupils are equal, round, and  reactive to light.  ?   Comments: Bilateral hooded upper eyelids, covers at least the upper 1/3 of her iris.   ?Neck:  ?   Thyroid: No thyroid mass, thyromegaly or thyroid tenderness.  ?   Vascular: No carotid bruit or JVD.  ?   Trachea: Trachea and phonation normal.  ?Cardiovascular:  ?   Rate and Rhythm: Normal rate and regular rhythm.  ?   Chest Wall: PMI is not displaced.  ?   Pulses: Normal pulses.  ?   Heart sounds: Normal heart sounds. No murmur heard. ?  No friction rub. No gallop.  ?Pulmonary:  ?   Effort: Pulmonary effort is normal. No respiratory distress.  ?   Breath sounds: Normal breath sounds. No wheezing.  ?Abdominal:  ?   General: There is no abdominal bruit.  ?   Palpations: There is no hepatomegaly or splenomegaly.  ?Musculoskeletal:      ?   General: Normal range of motion.  ?   Cervical back: Normal range of motion and neck supple.  ?   Right lower leg: No edema.  ?   Left lower leg: No edema.  ?Lymphadenopathy:  ?   Cervical: No cervical adenopathy.  ?Ski

## 2021-10-22 NOTE — Patient Instructions (Addendum)
Continue to monitor your blood sugars as we discussed and record them. Bring the log to your next appointment.  Take your medications as directed.   ? ?Goal Blood glucose:  ?  Fasting (before meals) = 80 to 130 ?  Within 2 hours of eating = less than 180 ? ? ?Understanding your Hemoglobin A1c: 7.0 today - great! ? ? ? ? ?Diabetes Mellitus and Nutrition ? ? ? ?I think that you would greatly benefit from seeing a nutritionist. If this is something you are interested in, please call Dr Jenne Campus at [phone] to schedule an appointment. ? ? ?When you have diabetes (diabetes mellitus), it is very important to have healthy eating habits because your blood sugar (glucose) levels are greatly affected by what you eat and drink. Eating healthy foods in the appropriate amounts, at about the same times every day, can help you: ?Control your blood glucose. ?Lower your risk of heart disease. ?Improve your blood pressure. ?Reach or maintain a healthy weight. ? ?Every person with diabetes is different, and each person has different needs for a meal plan. Your health care provider may recommend that you work with a diet and nutrition specialist (dietitian) to make a meal plan that is best for you. Your meal plan may vary depending on factors such as: ?The calories you need. ?The medicines you take. ?Your weight. ?Your blood glucose, blood pressure, and cholesterol levels. ?Your activity level. ?Other health conditions you have, such as heart or kidney disease. ? ?How do carbohydrates affect me? ?Carbohydrates affect your blood glucose level more than any other type of food. Eating carbohydrates naturally increases the amount of glucose in your blood. Carbohydrate counting is a method for keeping track of how many carbohydrates you eat. Counting carbohydrates is important to keep your blood glucose at a healthy level, especially if you use insulin or take certain oral diabetes medicines. ?It is important to know how many carbohydrates  you can safely have in each meal. This is different for every person. Your dietitian can help you calculate how many carbohydrates you should have at each meal and for snack. ?Foods that contain carbohydrates include: ?Bread, cereal, rice, pasta, and crackers. ?Potatoes and corn. ?Peas, beans, and lentils. ?Milk and yogurt. ?Fruit and juice. ?Desserts, such as cakes, cookies, ice cream, and candy. ? ?How does alcohol affect me? ?Alcohol can cause a sudden decrease in blood glucose (hypoglycemia), especially if you use insulin or take certain oral diabetes medicines. Hypoglycemia can be a life-threatening condition. Symptoms of hypoglycemia (sleepiness, dizziness, and confusion) are similar to symptoms of having too much alcohol. ?If your health care provider says that alcohol is safe for you, follow these guidelines: ?Limit alcohol intake to no more than 1 drink per day for nonpregnant women and 2 drinks per day for men. One drink equals 12 oz of beer, 5 oz of wine, or 1? oz of hard liquor. ?Do not drink on an empty stomach. ?Keep yourself hydrated with water, diet soda, or unsweetened iced tea. ?Keep in mind that regular soda, juice, and other mixers may contain a lot of sugar and must be counted as carbohydrates. ? ?What are tips for following this plan? ? ?Reading food labels ?Start by checking the serving size on the label. The amount of calories, carbohydrates, fats, and other nutrients listed on the label are based on one serving of the food. Many foods contain more than one serving per package. ?Check the total grams (g) of carbohydrates in one serving.  You can calculate the number of servings of carbohydrates in one serving by dividing the total carbohydrates by 15. For example, if a food has 30 g of total carbohydrates, it would be equal to 2 servings of carbohydrates. ?Check the number of grams (g) of saturated and trans fats in one serving. Choose foods that have low or no amount of these fats. ?Check the  number of milligrams (mg) of sodium in one serving. Most people should limit total sodium intake to less than 2,300 mg per day. ?Always check the nutrition information of foods labeled as "low-fat" or "nonfat". These foods may be higher in added sugar or refined carbohydrates and should be avoided. ?Talk to your dietitian to identify your daily goals for nutrients listed on the label. ? ?Shopping ?Avoid buying canned, premade, or processed foods. These foods tend to be high in fat, sodium, and added sugar. ?Shop around the outside edge of the grocery store. This includes fresh fruits and vegetables, bulk grains, fresh meats, and fresh dairy. ? ?Cooking ?Use low-heat cooking methods, such as baking, instead of high-heat cooking methods like deep frying. ?Cook using healthy oils, such as olive, canola, or sunflower oil. ?Avoid cooking with butter, cream, or high-fat meats. ? ?Meal planning ?Eat meals and snacks regularly, preferably at the same times every day. Avoid going long periods of time without eating. ?Eat foods high in fiber, such as fresh fruits, vegetables, beans, and whole grains. Talk to your dietitian about how many servings of carbohydrates you can eat at each meal. ?Eat 4-6 ounces of lean protein each day, such as lean meat, chicken, fish, eggs, or tofu. 1 ounce is equal to 1 ounce of meat, chicken, or fish, 1 egg, or 1/4 cup of tofu. ?Eat some foods each day that contain healthy fats, such as avocado, nuts, seeds, and fish. ? ?Lifestyle ? ?Check your blood glucose regularly. ?Exercise at least 30 minutes 5 or more days each week, or as told by your health care provider. ?Take medicines as told by your health care provider. ?Do not use any products that contain nicotine or tobacco, such as cigarettes and e-cigarettes. If you need help quitting, ask your health care provider. ?Work with a Social worker or diabetes educator to identify strategies to manage stress and any emotional and social  challenges. ? ?What are some questions to ask my health care provider? ?Do I need to meet with a diabetes educator? ?Do I need to meet with a dietitian? ?What number can I call if I have questions? ?When are the best times to check my blood glucose? ? ?Where to find more information: ?American Diabetes Association: diabetes.org/food-and-fitness/food ?Academy of Nutrition and Dietetics: PokerClues.dk ?[employer] of Diabetes and Digestive and Kidney Diseases (NIH): ContactWire.be ? ?Summary ?A healthy meal plan will help you control your blood glucose and maintain a healthy lifestyle. ?Working with a diet and nutrition specialist (dietitian) can help you make a meal plan that is best for you. ?Keep in mind that carbohydrates and alcohol have immediate effects on your blood glucose levels. It is important to count carbohydrates and to use alcohol carefully. ?This information is not intended to replace advice given to you by your health care provider. Make sure you discuss any questions you have with your health care provider. ?Document Released: 04/07/2005 Document Revised: 08/15/2016 Document Reviewed: 08/15/2016 ?Elsevier Interactive Patient Education ? 2018 [place]. ? ? ? ?

## 2021-10-23 LAB — BMP8+EGFR
BUN/Creatinine Ratio: 14 (ref 12–28)
BUN: 17 mg/dL (ref 8–27)
CO2: 24 mmol/L (ref 20–29)
Calcium: 9.8 mg/dL (ref 8.7–10.3)
Chloride: 103 mmol/L (ref 96–106)
Creatinine, Ser: 1.19 mg/dL — ABNORMAL HIGH (ref 0.57–1.00)
Glucose: 160 mg/dL — ABNORMAL HIGH (ref 70–99)
Potassium: 4.7 mmol/L (ref 3.5–5.2)
Sodium: 139 mmol/L (ref 134–144)
eGFR: 47 mL/min/{1.73_m2} — ABNORMAL LOW (ref >59)

## 2021-11-02 ENCOUNTER — Other Ambulatory Visit: Payer: Self-pay | Admitting: Family Medicine

## 2021-11-02 MED ORDER — ATORVASTATIN CALCIUM 80 MG PO TABS: 80.0000 mg | ORAL_TABLET | Freq: Every day | ORAL | 0 refills | Status: DC

## 2021-11-23 ENCOUNTER — Telehealth: Payer: Self-pay | Admitting: Family Medicine

## 2021-11-23 NOTE — Telephone Encounter (Signed)
?  Left message for patient to call back and schedule Medicare Annual Wellness Visit (AWV) to be completed by video or phone. ? ?No hx of AWV eligible for AWVI per [employer] as of 08/26/2011 ? ?Please schedule at anytime with [facility] Karle Starch ? ?17 Minutes appointment  ? ?Any questions, please call me at [phone]   ?

## 2022-01-26 ENCOUNTER — Ambulatory Visit: Payer: Medicare HMO | Admitting: Family Medicine

## 2022-01-28 ENCOUNTER — Ambulatory Visit (INDEPENDENT_AMBULATORY_CARE_PROVIDER_SITE_OTHER): Payer: Medicare HMO | Admitting: Family Medicine

## 2022-01-28 ENCOUNTER — Telehealth: Payer: Self-pay | Admitting: Family Medicine

## 2022-01-28 ENCOUNTER — Encounter: Payer: Self-pay | Admitting: Family Medicine

## 2022-01-28 ENCOUNTER — Other Ambulatory Visit: Payer: Self-pay | Admitting: Family Medicine

## 2022-01-28 VITALS — BP 132/73 | HR 73 | Temp 98.2°F | Ht 65.0 in | Wt 224.0 lb

## 2022-01-28 DIAGNOSIS — H02839 Dermatochalasis of unspecified eye, unspecified eyelid: Secondary | ICD-10-CM | POA: Diagnosis not present

## 2022-01-28 DIAGNOSIS — E1169 Type 2 diabetes mellitus with other specified complication: Secondary | ICD-10-CM | POA: Diagnosis not present

## 2022-01-28 DIAGNOSIS — H539 Unspecified visual disturbance: Secondary | ICD-10-CM | POA: Diagnosis not present

## 2022-01-28 LAB — BAYER DCA HB A1C WAIVED: HB A1C (BAYER DCA - WAIVED): 7 % — ABNORMAL HIGH (ref 4.8–5.6)

## 2022-01-28 NOTE — Patient Instructions (Addendum)

## 2022-01-28 NOTE — Telephone Encounter (Signed)
Patient aware.

## 2022-01-28 NOTE — Addendum Note (Signed)
Addended by: Baruch Gouty on: 01/28/2022 04:07 PM   Modules accepted: Orders

## 2022-01-28 NOTE — Telephone Encounter (Signed)
Pt left message this message on appt request for PCP.  Sharyn Lull I checked with my insurance and they cover that surgery so if you can please send another referral to [facility] for me    Thank you

## 2022-01-28 NOTE — Progress Notes (Signed)
Subjective:  Patient ID: Kristina Peterson, female    DOB: 1945/10/05, 76 y.o.   MRN: [id number]  Patient Care Team: Baruch Gouty, FNP as PCP - General (Family Medicine)   Chief Complaint:  Medical Management of Chronic Issues   HPI: Kristina Peterson is a 76 y.o. female presenting on 01/28/2022 for Medical Management of Chronic Issues   Pt presents today for diabetes management. She has been on Ozempic 0.5 mg weekly and is tolerating fairly well. She does report minimal nausea, not often. No other reported signs or symptoms. She is on ACEi, ASA, and statin. Recent eye exam results requested.     Relevant past medical, surgical, family, and social history reviewed and updated as indicated.  Allergies and medications reviewed and updated. Data reviewed: Chart in Epic.   Past Medical History:  Diagnosis Date   Diabetes mellitus without complication ([place])    Hypertension     Past Surgical History:  Procedure Laterality Date   DILATION AND CURETTAGE OF UTERUS     HYSTEROSCOPY WITH D & C N/A 02/13/2015   Procedure: HYSTEROSCOPY, UTERINE CURETTAGE;  Surgeon: Florian Buff, MD;  Location: [facility];  Service: Gynecology;  Laterality: N/A;    Social History   Socioeconomic History   Marital status: Married    Spouse name: Not on file   Number of children: Not on file   Years of education: Not on file   Highest education level: Not on file  Occupational History   Not on file  Tobacco Use   Smoking status: Never   Smokeless tobacco: Never  Substance and Sexual Activity   Alcohol use: No    Alcohol/week: 0.0 standard drinks of alcohol   Drug use: No   Sexual activity: Not Currently  Other Topics Concern   Not on file  Social History Narrative   Not on file   Social Determinants of Health   Financial Resource Strain: Not on file  Food Insecurity: Not on file  Transportation Needs: Not on file  Physical Activity: Not on file  Stress: Not on file  Social Connections: Not on  file  Intimate Partner Violence: Not on file    Outpatient Encounter Medications as of 01/28/2022  Medication Sig   aspirin EC 81 MG tablet Take 81 mg by mouth daily.   atorvastatin (LIPITOR) 80 MG tablet TAKE 1 TABLET BY MOUTH AT BEDTIME   Cholecalciferol 50 MCG (2000 UT) CAPS Take by mouth.   folic acid (FOLVITE) 1 MG tablet Take by mouth.   ibuprofen (ADVIL) 200 MG tablet Take by mouth.   lisinopril (ZESTRIL) 10 MG tablet Take 1 tablet (10 mg total) by mouth daily.   metoprolol succinate (TOPROL-XL) 50 MG 24 hr tablet Take 1 tablet by mouth daily.   omega-3 fish oil (MAXEPA) 1000 MG CAPS capsule Take 1 capsule by mouth daily.   Semaglutide,0.25 or 0.5MG/DOS, (OZEMPIC, 0.25 OR 0.5 MG/DOSE,) 2 MG/1.5ML SOPN Inject 0.5 mg into the skin once a week.   No facility-administered encounter medications on file as of 01/28/2022.    No Known Allergies  Review of Systems  Constitutional:  Negative for activity change, appetite change, chills, diaphoresis, fatigue, fever and unexpected weight change.  HENT: Negative.    Eyes:  Positive for visual disturbance (due to hooded eyelids). Negative for photophobia, pain, discharge, redness and itching.  Respiratory:  Negative for cough, chest tightness and shortness of breath.   Cardiovascular:  Negative for chest pain,  palpitations and leg swelling.  Gastrointestinal:  Negative for abdominal pain, blood in stool, constipation, diarrhea, nausea and vomiting.  Endocrine: Negative.  Negative for polydipsia, polyphagia and polyuria.  Genitourinary:  Negative for decreased urine volume, difficulty urinating, dysuria, frequency and urgency.  Musculoskeletal:  Negative for arthralgias and myalgias.  Skin: Negative.   Allergic/Immunologic: Negative.   Neurological:  Negative for dizziness, weakness and headaches.  Hematological: Negative.   Psychiatric/Behavioral:  Negative for confusion, hallucinations, sleep disturbance and suicidal ideas.   All other  systems reviewed and are negative.       Objective:  BP 132/73   Pulse 73   Temp 98.2 F (36.8 C)   Ht 5' 5"  (1.651 m)   Wt 224 lb (101.6 kg)   SpO2 92%   BMI 37.28 kg/m    Wt Readings from Last 3 Encounters:  01/28/22 224 lb (101.6 kg)  10/22/21 225 lb (102.1 kg)  08/24/21 226 lb (102.5 kg)    Physical Exam Vitals and nursing note reviewed.  Constitutional:      General: She is not in acute distress.    Appearance: Normal appearance. She is obese. She is not ill-appearing, toxic-appearing or diaphoretic.  HENT:     Head: Normocephalic and atraumatic.  Eyes:     Pupils: Pupils are equal, round, and reactive to light.     Comments:  Bilateral hooded upper eyelids, covers at least the upper 1/3 of her iris.   Cardiovascular:     Rate and Rhythm: Normal rate and regular rhythm.     Heart sounds: Normal heart sounds.  Pulmonary:     Effort: Pulmonary effort is normal.     Breath sounds: Normal breath sounds.  Musculoskeletal:     Cervical back: Neck supple.  Skin:    General: Skin is warm and dry.     Capillary Refill: Capillary refill takes less than 2 seconds.  Neurological:     General: No focal deficit present.     Mental Status: She is alert and oriented to person, place, and time.  Psychiatric:        Mood and Affect: Mood normal.        Behavior: Behavior normal.        Thought Content: Thought content normal.        Judgment: Judgment normal.     Results for orders placed or performed in visit on 10/22/21  BMP8+EGFR  Result Value Ref Range   Glucose 160 (H) 70 - 99 mg/dL   BUN 17 8 - 27 mg/dL   Creatinine, Ser 1.19 (H) 0.57 - 1.00 mg/dL   eGFR 47 (L) >59 mL/min/1.73   BUN/Creatinine Ratio 14 12 - 28   Sodium 139 134 - 144 mmol/L   Potassium 4.7 3.5 - 5.2 mmol/L   Chloride 103 96 - 106 mmol/L   CO2 24 20 - 29 mmol/L   Calcium 9.8 8.7 - 10.3 mg/dL  Bayer DCA Hb A1c Waived  Result Value Ref Range   HB A1C (BAYER DCA - WAIVED) 7.0 (H) 4.8 - 5.6 %        Pertinent labs & imaging results that were available during my care of the patient were reviewed by me and considered in my medical decision making.  Assessment & Plan:  Man was seen today for medical management of chronic issues.  Diagnoses and all orders for this visit:  Type 2 diabetes mellitus with other specified complication, without long-term current use of insulin ([place]) A1C  7.0 today, well controlled. Will continue current regimen. Diet and exercise encouraged.  -     Bayer DCA Hb A1c Waived -     BMP8+EGFR     Continue all other maintenance medications.  Follow up plan: Return in about 3 months (around 04/30/2022), or if symptoms worsen or fail to improve, for DM.   Continue healthy lifestyle choices, including diet (rich in fruits, vegetables, and lean proteins, and low in salt and simple carbohydrates) and exercise (at least 30 minutes of moderate physical activity daily).  Educational handout given for DM  The above assessment and management plan was discussed with the patient. The patient verbalized understanding of and has agreed to the management plan. Patient is aware to call the clinic if they develop any new symptoms or if symptoms persist or worsen. Patient is aware when to return to the clinic for a follow-up visit. Patient educated on when it is appropriate to go to the emergency department.   Monia Pouch, FNP-C [facility] [phone]

## 2022-01-29 LAB — BMP8+EGFR
BUN/Creatinine Ratio: 10 — ABNORMAL LOW (ref 12–28)
BUN: 13 mg/dL (ref 8–27)
CO2: 24 mmol/L (ref 20–29)
Calcium: 9.7 mg/dL (ref 8.7–10.3)
Chloride: 105 mmol/L (ref 96–106)
Creatinine, Ser: 1.27 mg/dL — ABNORMAL HIGH (ref 0.57–1.00)
Glucose: 144 mg/dL — ABNORMAL HIGH (ref 70–99)
Potassium: 4.6 mmol/L (ref 3.5–5.2)
Sodium: 141 mmol/L (ref 134–144)
eGFR: 44 mL/min/{1.73_m2} — ABNORMAL LOW (ref >59)

## 2022-04-26 ENCOUNTER — Ambulatory Visit (INDEPENDENT_AMBULATORY_CARE_PROVIDER_SITE_OTHER): Payer: Medicare HMO

## 2022-04-26 DIAGNOSIS — Z0000 Encounter for general adult medical examination without abnormal findings: Secondary | ICD-10-CM | POA: Diagnosis not present

## 2022-04-26 NOTE — Progress Notes (Signed)
MEDICARE ANNUAL WELLNESS VISIT  04/26/2022  Telephone Visit Disclaimer This Medicare AWV was conducted by telephone due to national recommendations for restrictions regarding the COVID-19 Pandemic (e.g. social distancing).  I verified, using two identifiers, that I am speaking with Kristina Peterson or their authorized healthcare agent. I discussed the limitations, risks, security, and privacy concerns of performing an evaluation and management service by telephone and the potential availability of an in-person appointment in the future. The patient expressed understanding and agreed to proceed.  Location of Patient: Home Location of Provider (nurse):  [facility]  Subjective:    Kristina Peterson is a 76 y.o. female patient of Rakes, Connye Burkitt, FNP who had a Medicare Annual Wellness Visit today via telephone. Derinda is Retired and lives with their spouse.  She has four children, eight grandchildren, and nine great grandchildren.  She reports that she is socially active and does interact with friends/family regularly. She is minimally physically active and enjoys spending time with family and attending church.  Patient Care Team: Baruch Gouty, FNP as PCP - General (Family Medicine) [place]     04/26/2022   11:15 AM 02/11/2015    1:47 PM 12/03/2014   11:25 AM  Advanced Directives  Does Patient Have a Medical Advance Directive? Yes No No  Type of Paramedic of [place];Living will    Does patient want to make changes to medical advance directive? No - Patient declined No - Patient declined   Copy of [place] in Chart? No - copy requested    Would patient like information on creating a medical advance directive?   No - patient declined information    Hospital Utilization Over the Past 12 Months: # of hospitalizations or ER visits: 0 # of surgeries: 0  Review of Systems    Patient reports that her overall health is better compared to last  year.  History obtained from chart review and the patient  Patient Reported Readings (BP, Pulse, CBG, Weight, etc) none  Pain Assessment Pain : No/denies pain     Current Medications & Allergies (verified) Allergies as of 04/26/2022   No Known Allergies      Medication List        Accurate as of April 26, 2022 11:28 AM. If you have any questions, ask your nurse or doctor.          aspirin EC 81 MG tablet Take 81 mg by mouth daily.   atorvastatin 80 MG tablet Commonly known as: LIPITOR TAKE 1 TABLET BY MOUTH AT BEDTIME   Cholecalciferol 50 MCG (2000 UT) Caps Take by mouth.   folic acid 1 MG tablet Commonly known as: FOLVITE Take by mouth.   ibuprofen 200 MG tablet Commonly known as: ADVIL Take by mouth.   lisinopril 10 MG tablet Commonly known as: ZESTRIL Take 1 tablet (10 mg total) by mouth daily.   metoprolol succinate 50 MG 24 hr tablet Commonly known as: TOPROL-XL Take 1 tablet by mouth daily.   omega-3 fish oil 1000 MG Caps capsule Commonly known as: MAXEPA Take 1 capsule by mouth daily.   Ozempic (0.25 or 0.5 MG/DOSE) 2 MG/1.5ML Sopn Generic drug: Semaglutide(0.25 or 0.'5MG'$ /DOS) Inject 0.5 mg into the skin once a week.        History (reviewed): Past Medical History:  Diagnosis Date   Diabetes mellitus without complication ([place])    Hypertension    Past Surgical History:  Procedure  Laterality Date   DILATION AND CURETTAGE OF UTERUS     HYSTEROSCOPY WITH D & C N/A 02/13/2015   Procedure: HYSTEROSCOPY, UTERINE CURETTAGE;  Surgeon: Florian Buff, MD;  Location: [facility];  Service: Gynecology;  Laterality: N/A;   Family History  Problem Relation Age of Onset   Cancer Mother        cervical   Cancer Maternal Aunt        lung   Cancer Maternal Uncle    Diabetes Maternal Grandmother    Heart disease Paternal Grandfather    Social History   Socioeconomic History   Marital status: Married    Spouse name: Not on file   Number of  children: Not on file   Years of education: Not on file   Highest education level: Not on file  Occupational History   Not on file  Tobacco Use   Smoking status: Never   Smokeless tobacco: Never  Substance and Sexual Activity   Alcohol use: No    Alcohol/week: 0.0 standard drinks of alcohol   Drug use: No   Sexual activity: Not Currently  Other Topics Concern   Not on file  Social History Narrative   Not on file   Social Determinants of Health   Financial Resource Strain: Not on file  Food Insecurity: Not on file  Transportation Needs: Not on file  Physical Activity: Not on file  Stress: Not on file  Social Connections: Not on file    Activities of Daily Living    04/26/2022   11:16 AM  In your present state of health, do you have any difficulty performing the following activities:  Hearing? 0  Vision? 0  Difficulty concentrating or making decisions? 0  Walking or climbing stairs? 0  Dressing or bathing? 0  Doing errands, shopping? 0  Preparing Food and eating ? N  Using the Toilet? N  In the past six months, have you accidently leaked urine? N  Do you have problems with loss of bowel control? N  Managing your Medications? N  Managing your Finances? N  Housekeeping or managing your Housekeeping? N    Patient Education/ Literacy How often do you need to have someone help you when you read instructions, pamphlets, or other written materials from your doctor or pharmacy?: 1 - Never What is the last grade level you completed in school?: GED  Exercise Current Exercise Habits: The patient does not participate in regular exercise at present, Exercise limited by: orthopedic condition(s)  Diet Patient reports consuming 2 meals a day and 1 snack(s) a day Patient reports that her primary diet is: Regular Patient reports that she does have regular access to food.   Depression Screen    01/28/2022   10:15 AM 10/22/2021   10:11 AM 08/24/2021   10:04 AM 07/22/2021     9:16 AM  PHQ 2/9 Scores  PHQ - 2 Score '1 1 2 2  '$ PHQ- 9 Score '3 3 6 7     '$ Fall Risk    04/26/2022   11:27 AM 01/28/2022   10:15 AM 10/22/2021   10:13 AM 08/24/2021   10:03 AM 07/22/2021    9:17 AM  Fall Risk   Falls in the past year? 0 0 0 0 0  Number falls in past yr:   0    Injury with Fall?   0    Follow up Falls evaluation completed  Education provided       Objective:  Stanton Kidney  Elberta Spaniel seemed alert and oriented and she participated appropriately during our telephone visit.  Blood Pressure Weight BMI  BP Readings from Last 3 Encounters:  01/28/22 132/73  10/22/21 116/69  08/24/21 113/68   Wt Readings from Last 3 Encounters:  01/28/22 224 lb (101.6 kg)  10/22/21 225 lb (102.1 kg)  08/24/21 226 lb (102.5 kg)   BMI Readings from Last 1 Encounters:  01/28/22 37.28 kg/m    *Unable to obtain current vital signs, weight, and BMI due to telephone visit type  Hearing/Vision  Stanton Kidney did not seem to have difficulty with hearing/understanding during the telephone conversation Reports that she has had a formal eye exam by an eye care professional within the past year Reports that she has not had a formal hearing evaluation within the past year *Unable to fully assess hearing and vision during telephone visit type  Cognitive Function:    04/26/2022   11:18 AM  6CIT Screen  What Year? 0 points  What month? 0 points  What time? 0 points  Count back from 20 0 points  Months in reverse 0 points  Repeat phrase 0 points  Total Score 0 points   (Normal:0-7, Significant for Dysfunction: >8)  Normal Cognitive Function Screening: Yes   Immunization & Health Maintenance Record Immunization History  Administered Date(s) Administered   MODERNA COVID-19 SARS-COV-2 PEDS BIVALENT BOOSTER 6Y-11Y 06/26/2020    Health Maintenance  Topic Date Due   COVID-19 Vaccine (2 - Moderna risk series) 07/24/2020   INFLUENZA VACCINE  Never done   Zoster Vaccines- Shingrix (1 of 2) 04/30/2022  (Originally 09/04/1964)   Pneumonia Vaccine 21+ Years old (1 - PCV) 07/22/2022 (Originally 09/04/2010)   TETANUS/TDAP  07/22/2022 (Originally 09/04/1964)   Hepatitis C Screening  07/22/2022 (Originally 09/05/1963)   OPHTHALMOLOGY EXAM  05/20/2022   Diabetic kidney evaluation - Urine ACR  07/22/2022   HEMOGLOBIN A1C  07/31/2022   FOOT EXAM  08/24/2022   Diabetic kidney evaluation - GFR measurement  01/29/2023   DEXA SCAN  Completed   HPV VACCINES  Aged Out       Assessment  This is a routine wellness examination for Kristina Peterson.  Health Maintenance: Due or Overdue Health Maintenance Due  Topic Date Due   COVID-19 Vaccine (2 - Moderna risk series) 07/24/2020   INFLUENZA VACCINE  Never done    Kristina Peterson does not need a referral for Community Assistance: Care Management:   no Social Work:    no Prescription Assistance:  no Nutrition/Diabetes Education:  no   Plan:  Personalized Goals  Goals Addressed             This Visit's Progress    Patient Stated              04/26/2022 AWV Goal: Exercise for General Health  Patient will verbalize understanding of the benefits of increased physical activity: Exercising regularly is important. It will improve your overall fitness, flexibility, and endurance. Regular exercise also will improve your overall health. It can help you control your weight, reduce stress, and improve your bone density. Over the next year, patient will increase physical activity as tolerated with a goal of at least 150 minutes of moderate physical activity per week.  You can tell that you are exercising at a moderate intensity if your heart starts beating faster and you start breathing faster but can still hold a conversation. Moderate-intensity exercise ideas include: Walking 1 mile (1.6 km) in about 15 minutes Biking  Hiking Golfing Dancing Water aerobics Patient will verbalize understanding of everyday activities that increase physical activity  by providing examples like the following: Yard work, such as: Sales promotion account executive Gardening Washing windows or floors Patient will be able to explain general safety guidelines for exercising:  Before you start a new exercise program, talk with your health care provider. Do not exercise so much that you hurt yourself, feel dizzy, or get very short of breath. Wear comfortable clothes and wear shoes with good support. Drink plenty of water while you exercise to prevent dehydration or heat stroke. Work out until your breathing and your heartbeat get faster.                         Personalized Health Maintenance & Screening Recommendations  Pneumococcal vaccine  Influenza vaccine Td vaccine Shingrix vaccine  Lung Cancer Screening Recommended: no (Low Dose CT Chest recommended if Age 39-80 years, 30 pack-year currently smoking OR have quit w/in past 15 years) Hepatitis C Screening recommended: yes HIV Screening recommended: no  Advanced Directives: Written information was not prepared per patient's request.  Referrals & Orders No orders of the defined types were placed in this encounter.   Follow-up Plan Follow-up with Rakes, Connye Burkitt, FNP as planned   I have personally reviewed and noted the following in the patient's chart:   Medical and social history Use of alcohol, tobacco or illicit drugs  Current medications and supplements Functional ability and status Nutritional status Physical activity Advanced directives List of other physicians Hospitalizations, surgeries, and ER visits in previous 12 months Vitals Screenings to include cognitive, depression, and falls Referrals and appointments  In addition, I have reviewed and discussed with Kristina Peterson certain preventive protocols, quality metrics, and best practice recommendations. A written personalized care plan for  preventive services as well as general preventive health recommendations is available and can be mailed to the patient at her request.      Felicity Coyer, LPN    [id number]  Patient declined after visit summary.

## 2022-05-03 ENCOUNTER — Other Ambulatory Visit: Payer: Self-pay | Admitting: Family Medicine

## 2022-05-03 MED ORDER — ATORVASTATIN CALCIUM 80 MG PO TABS: 80.0000 mg | ORAL_TABLET | Freq: Every day | ORAL | 0 refills | Status: DC

## 2022-05-14 ENCOUNTER — Other Ambulatory Visit: Payer: Self-pay | Admitting: Family Medicine

## 2022-05-14 DIAGNOSIS — E1169 Type 2 diabetes mellitus with other specified complication: Secondary | ICD-10-CM

## 2022-06-22 ENCOUNTER — Other Ambulatory Visit: Payer: Self-pay | Admitting: Family Medicine

## 2022-06-22 DIAGNOSIS — E1169 Type 2 diabetes mellitus with other specified complication: Secondary | ICD-10-CM

## 2022-06-23 ENCOUNTER — Telehealth: Payer: Self-pay | Admitting: Family Medicine

## 2022-06-23 DIAGNOSIS — E1169 Type 2 diabetes mellitus with other specified complication: Secondary | ICD-10-CM

## 2022-06-23 MED ORDER — OZEMPIC (0.25 OR 0.5 MG/DOSE) 2 MG/3ML ~~LOC~~ SOPN: 0.5000 mg | PEN_INJECTOR | SUBCUTANEOUS | 0 refills | Status: DC

## 2022-06-23 MED ORDER — METOPROLOL SUCCINATE ER 50 MG PO TB24: 50.0000 mg | ORAL_TABLET | Freq: Every day | ORAL | 0 refills | Status: DC

## 2022-06-23 NOTE — Telephone Encounter (Signed)
  Prescription Request  06/23/2022  Is this a "Controlled Substance" medicine?   Have you seen your PCP in the last 2 weeks? Appt made for 12/1  If YES, route message to pool  -  If NO, patient needs to be scheduled for appointment.  What is the name of the medication or equipment? Swain   Have you contacted your pharmacy to request a refill? yes  Which pharmacy would you like this sent to?  [place], [place].      Patient notified that their request is being sent to the clinical staff for review and that they should receive a response within 2 business days.

## 2022-06-23 NOTE — Telephone Encounter (Signed)
Refill sent to pharmacy, patient aware ?

## 2022-06-24 ENCOUNTER — Encounter: Payer: Self-pay | Admitting: Family Medicine

## 2022-06-24 ENCOUNTER — Ambulatory Visit (INDEPENDENT_AMBULATORY_CARE_PROVIDER_SITE_OTHER): Payer: Medicare HMO | Admitting: Family Medicine

## 2022-06-24 VITALS — BP 137/82 | HR 82 | Temp 96.2°F | Ht 65.0 in | Wt 222.8 lb

## 2022-06-24 DIAGNOSIS — E785 Hyperlipidemia, unspecified: Secondary | ICD-10-CM

## 2022-06-24 DIAGNOSIS — K219 Gastro-esophageal reflux disease without esophagitis: Secondary | ICD-10-CM

## 2022-06-24 DIAGNOSIS — E1169 Type 2 diabetes mellitus with other specified complication: Secondary | ICD-10-CM | POA: Diagnosis not present

## 2022-06-24 DIAGNOSIS — I152 Hypertension secondary to endocrine disorders: Secondary | ICD-10-CM

## 2022-06-24 DIAGNOSIS — Z23 Encounter for immunization: Secondary | ICD-10-CM

## 2022-06-24 DIAGNOSIS — E1159 Type 2 diabetes mellitus with other circulatory complications: Secondary | ICD-10-CM | POA: Diagnosis not present

## 2022-06-24 LAB — BAYER DCA HB A1C WAIVED: HB A1C (BAYER DCA - WAIVED): 7 % — ABNORMAL HIGH (ref 4.8–5.6)

## 2022-06-24 NOTE — Progress Notes (Signed)
Subjective:  Patient ID: ALEIDA CRANDELL, female    DOB: 1945/09/10, 76 y.o.   MRN: [id number]  Patient Care Team: Baruch Gouty, FNP as PCP - General (Family Medicine) [place]   Chief Complaint:  Diabetes (3 month follow up) and Gastroesophageal Reflux   HPI: KIERAH GOATLEY is a 76 y.o. female presenting on 06/24/2022 for Diabetes (3 month follow up) and Gastroesophageal Reflux   1. Hyperlipidemia associated with type 2 diabetes mellitus ([place]) On statin therapy and tolerating well. Denies myalgias. Tries to follow a healthy diet. Does not follow an exercise routine.   2. Morbid obesity ([place]) Does try to follow a healthy diet but does not follow an exercise routine.   3. Type 2 diabetes mellitus with other specified complication, without long-term current use of insulin ([facility]) Currently on 0.5 mg Ozempic once weekly and tolerating well. Does report some increased reflux symptoms. Denies polyuria, polyphagia, or polydipsia.   4. Hypertension associated with diabetes ([place]) Taking medications as prescribed and tolerating well. Denies chest pain, headaches, weakness, confusion, leg swelling, or palpitations. Does try to follow a healthy diet. Does not exercise on a regular basis.   5. Gastroesophageal reflux disease without esophagitis Increased symptoms over the last several weeks. No hemoptysis, melena, hematochezia, cough, voice changes, or dysphagia. Reports she has only been taking her PPI therapy as needed and not on a daily basis.      Relevant past medical, surgical, family, and social history reviewed and updated as indicated.  Allergies and medications reviewed and updated. Data reviewed: Chart in Epic.   Past Medical History:  Diagnosis Date   Diabetes mellitus without complication ([place])    Hypertension     Past Surgical History:  Procedure Laterality Date   DILATION AND CURETTAGE OF UTERUS     HYSTEROSCOPY WITH D & C N/A 02/13/2015   Procedure:  HYSTEROSCOPY, UTERINE CURETTAGE;  Surgeon: Florian Buff, MD;  Location: [facility];  Service: Gynecology;  Laterality: N/A;    Social History   Socioeconomic History   Marital status: Married    Spouse name: Not on file   Number of children: Not on file   Years of education: Not on file   Highest education level: Not on file  Occupational History   Not on file  Tobacco Use   Smoking status: Never   Smokeless tobacco: Never  Substance and Sexual Activity   Alcohol use: No    Alcohol/week: 0.0 standard drinks of alcohol   Drug use: No   Sexual activity: Not Currently  Other Topics Concern   Not on file  Social History Narrative   Not on file   Social Determinants of Health   Financial Resource Strain: Not on file  Food Insecurity: Not on file  Transportation Needs: Not on file  Physical Activity: Not on file  Stress: Not on file  Social Connections: Not on file  Intimate Partner Violence: Not on file    Outpatient Encounter Medications as of 06/24/2022  Medication Sig   aspirin EC 81 MG tablet Take 81 mg by mouth daily.   atorvastatin (LIPITOR) 80 MG tablet Take 1 tablet (80 mg total) by mouth at bedtime.   Cholecalciferol 50 MCG (2000 UT) CAPS Take by mouth.   folic acid (FOLVITE) 1 MG tablet Take by mouth.   ibuprofen (ADVIL) 200 MG tablet Take by mouth.   lisinopril (ZESTRIL) 10 MG tablet Take 1 tablet (10 mg total) by  mouth daily.   metoprolol succinate (TOPROL-XL) 50 MG 24 hr tablet Take 1 tablet (50 mg total) by mouth daily. (NEEDS TO BE SEEN BEFORE NEXT REFILL)   omega-3 fish oil (MAXEPA) 1000 MG CAPS capsule Take 1 capsule by mouth daily.   omeprazole (PRILOSEC) 20 MG capsule Take 20 mg by mouth daily.   Semaglutide,0.25 or 0.5MG/DOS, (OZEMPIC, 0.25 OR 0.5 MG/DOSE,) 2 MG/3ML SOPN Inject 0.5 mg into the skin once a week. (NEEDS TO BE SEEN BEFORE NEXT REFILL)   No facility-administered encounter medications on file as of 06/24/2022.    No Known Allergies  Review  of Systems  Constitutional:  Negative for activity change, appetite change, chills, diaphoresis, fever and unexpected weight change.  HENT: Negative.    Eyes: Negative.  Negative for photophobia and visual disturbance.  Respiratory:  Negative for chest tightness and shortness of breath.   Cardiovascular:  Negative for palpitations and leg swelling.  Gastrointestinal:  Negative for abdominal distention, anal bleeding, blood in stool, constipation, diarrhea, rectal pain and vomiting.       GERD  Endocrine: Negative.  Negative for cold intolerance and heat intolerance.  Genitourinary:  Negative for decreased urine volume, difficulty urinating, dysuria, frequency and urgency.  Musculoskeletal:  Negative for arthralgias and myalgias.  Skin: Negative.   Allergic/Immunologic: Negative.   Neurological:  Negative for syncope, facial asymmetry, light-headedness and numbness.  Hematological: Negative.   Psychiatric/Behavioral:  Negative for hallucinations, sleep disturbance and suicidal ideas.   All other systems reviewed and are negative.       Objective:  BP 137/82   Pulse 82   Temp (!) 96.2 F (35.7 C) (Temporal)   Ht _0  (1.651 m)   Wt 222 lb 12.8 oz (101.1 kg)   SpO2 95%   BMI 37.08 kg/m    Wt Readings from Last 3 Encounters:  06/24/22 222 lb 12.8 oz (101.1 kg)  01/28/22 224 lb (101.6 kg)  10/22/21 225 lb (102.1 kg)    Physical Exam Vitals and nursing note reviewed.  Constitutional:      General: She is not in acute distress.    Appearance: Normal appearance. She is well-developed and well-groomed. She is morbidly obese. She is not ill-appearing, toxic-appearing or diaphoretic.  HENT:     Head: Normocephalic and atraumatic.     Jaw: There is normal jaw occlusion.     Right Ear: Hearing normal.     Left Ear: Hearing normal.     Nose: Nose normal.     Mouth/Throat:     Lips: Pink.     Mouth: Mucous membranes are moist.     Pharynx: Oropharynx is clear. Uvula midline.   Eyes:     Extraocular Movements: Extraocular movements intact.     Conjunctiva/sclera: Conjunctivae normal.     Pupils: Pupils are equal, round, and reactive to light.  Neck:     Thyroid: No thyroid mass, thyromegaly or thyroid tenderness.     Vascular: No carotid bruit or JVD.     Trachea: Trachea and phonation normal.  Cardiovascular:     Rate and Rhythm: Normal rate and regular rhythm.     Chest Wall: PMI is not displaced.     Pulses: Normal pulses.     Heart sounds: Normal heart sounds. No murmur heard.    No friction rub. No gallop.  Pulmonary:     Effort: Pulmonary effort is normal. No respiratory distress.     Breath sounds: Normal breath sounds. No wheezing.  Abdominal:  General: Bowel sounds are normal. There is no distension or abdominal bruit.     Palpations: Abdomen is soft. There is no hepatomegaly or splenomegaly.     Tenderness: There is no abdominal tenderness. There is no right CVA tenderness or left CVA tenderness.     Hernia: No hernia is present.  Musculoskeletal:        General: Normal range of motion.     Cervical back: Normal range of motion and neck supple.     Right lower leg: No edema.     Left lower leg: No edema.  Lymphadenopathy:     Cervical: No cervical adenopathy.  Skin:    General: Skin is warm and dry.     Capillary Refill: Capillary refill takes less than 2 seconds.     Coloration: Skin is not cyanotic, jaundiced or pale.     Findings: No rash.  Neurological:     General: No focal deficit present.     Mental Status: She is alert and oriented to person, place, and time.     Sensory: Sensation is intact.     Motor: Motor function is intact.     Coordination: Coordination is intact.     Gait: Gait is intact.     Deep Tendon Reflexes: Reflexes are normal and symmetric.  Psychiatric:        Attention and Perception: Attention and perception normal.        Mood and Affect: Mood and affect normal.        Speech: Speech normal.         Behavior: Behavior normal. Behavior is cooperative.        Thought Content: Thought content normal.        Cognition and Memory: Cognition and memory normal.        Judgment: Judgment normal.     Results for orders placed or performed in visit on 06/24/22  Bayer DCA Hb A1c Waived  Result Value Ref Range   HB A1C (BAYER DCA - WAIVED) 7.0 (H) 4.8 - 5.6 %       Pertinent labs & imaging results that were available during my care of the patient were reviewed by me and considered in my medical decision making.  Assessment & Plan:  Juanda was seen today for diabetes and gastroesophageal reflux.  Diagnoses and all orders for this visit:  Hyperlipidemia associated with type 2 diabetes mellitus ([place]) Diet encouraged - increase intake of fresh fruits and vegetables, increase intake of lean proteins. Bake, broil, or grill foods. Avoid fried, greasy, and fatty foods. Avoid fast foods. Increase intake of fiber-rich whole grains. Exercise encouraged - at least 150 minutes per week and advance as tolerated. Goal BMI < 25. Continue medications as prescribed. Follow up in 3-6 months as discussed.  -     CMP14+EGFR -     Lipid panel  Morbid obesity ([place]) Diet and exercise encouraged. Labs pending.  -     Bayer DCA Hb A1c Waived -     CMP14+EGFR -     CBC with Differential/Platelet -     Thyroid Panel With TSH -     Lipid panel  Type 2 diabetes mellitus with other specified complication, without long-term current use of insulin (HCC) A1C 7.0 today. Other labs pending. Does have some nausea and reflux with Ozempic. Discussed ways to mitigate side effects. Diet and exercise encouraged.  -     Microalbumin / creatinine urine ratio -  Bayer DCA Hb A1c Waived -     CMP14+EGFR -     CBC with Differential/Platelet -     Thyroid Panel With TSH -     Lipid panel  Gastroesophageal reflux disease without esophagitis Has only been taking PPI therapy as needed. Advised to take daily to see if more  beneficial. No red flags concerning for esophagitis. Labs pending.  -     CBC with Differential/Platelet  Hypertension associated with diabetes ([place]) BP well controlled. Changes were not made in regimen today. Goal BP is 130/80. Pt aware to report any persistent high or low readings. DASH diet and exercise encouraged. Exercise at least 150 minutes per week and increase as tolerated. Goal BMI > 25. Stress management encouraged. Avoid nicotine and tobacco product use. Avoid excessive alcohol and NSAID's. Avoid more than 2000 mg of sodium daily. Medications as prescribed. Follow up as scheduled.  -     Microalbumin / creatinine urine ratio -     Bayer DCA Hb A1c Waived -     CMP14+EGFR -     CBC with Differential/Platelet -     Thyroid Panel With TSH -     Lipid panel  Need for tetanus booster -     Tdap vaccine greater than or equal to 7yo IM     Continue all other maintenance medications.  Follow up plan: Return in about 3 months (around 09/23/2022), or if symptoms worsen or fail to improve, for DM.   Continue healthy lifestyle choices, including diet (rich in fruits, vegetables, and lean proteins, and low in salt and simple carbohydrates) and exercise (at least 30 minutes of moderate physical activity daily).  Educational handout given for DM  The above assessment and management plan was discussed with the patient. The patient verbalized understanding of and has agreed to the management plan. Patient is aware to call the clinic if they develop any new symptoms or if symptoms persist or worsen. Patient is aware when to return to the clinic for a follow-up visit. Patient educated on when it is appropriate to go to the emergency department.   Monia Pouch, FNP-C [facility] [phone]

## 2022-06-24 NOTE — Patient Instructions (Signed)

## 2022-06-25 LAB — CBC WITH DIFFERENTIAL/PLATELET
Basophils Absolute: 0.1 10*3/uL (ref 0.0–0.2)
Basos: 1 %
EOS (ABSOLUTE): 0.1 10*3/uL (ref 0.0–0.4)
Eos: 1 %
Hematocrit: 39.5 % (ref 34.0–46.6)
Hemoglobin: 13.2 g/dL (ref 11.1–15.9)
Immature Grans (Abs): 0 10*3/uL (ref 0.0–0.1)
Immature Granulocytes: 0 %
Lymphocytes Absolute: 1.7 10*3/uL (ref 0.7–3.1)
Lymphs: 19 %
MCH: 30.5 pg (ref 26.6–33.0)
MCHC: 33.4 g/dL (ref 31.5–35.7)
MCV: 91 fL (ref 79–97)
Monocytes Absolute: 0.4 10*3/uL (ref 0.1–0.9)
Monocytes: 5 %
Neutrophils Absolute: 6.3 10*3/uL (ref 1.4–7.0)
Neutrophils: 74 %
Platelets: 219 10*3/uL (ref 150–450)
RBC: 4.33 x10E6/uL (ref 3.77–5.28)
RDW: 12 % (ref 11.7–15.4)
WBC: 8.5 10*3/uL (ref 3.4–10.8)

## 2022-06-25 LAB — THYROID PANEL WITH TSH
Free Thyroxine Index: 2.6 (ref 1.2–4.9)
T3 Uptake Ratio: 26 % (ref 24–39)
T4, Total: 10 ug/dL (ref 4.5–12.0)
TSH: 5.01 u[IU]/mL — ABNORMAL HIGH (ref 0.450–4.500)

## 2022-06-25 LAB — CMP14+EGFR
ALT: 18 IU/L (ref 0–32)
AST: 16 IU/L (ref 0–40)
Albumin/Globulin Ratio: 1.7 (ref 1.2–2.2)
Albumin: 4.5 g/dL (ref 3.8–4.8)
Alkaline Phosphatase: 101 IU/L (ref 44–121)
BUN/Creatinine Ratio: 14 (ref 12–28)
BUN: 18 mg/dL (ref 8–27)
Bilirubin Total: 0.7 mg/dL (ref 0.0–1.2)
CO2: 26 mmol/L (ref 20–29)
Calcium: 10.5 mg/dL — ABNORMAL HIGH (ref 8.7–10.3)
Chloride: 100 mmol/L (ref 96–106)
Creatinine, Ser: 1.25 mg/dL — ABNORMAL HIGH (ref 0.57–1.00)
Globulin, Total: 2.6 g/dL (ref 1.5–4.5)
Glucose: 153 mg/dL — ABNORMAL HIGH (ref 70–99)
Potassium: 4.9 mmol/L (ref 3.5–5.2)
Sodium: 135 mmol/L (ref 134–144)
Total Protein: 7.1 g/dL (ref 6.0–8.5)
eGFR: 45 mL/min/{1.73_m2} — ABNORMAL LOW (ref >59)

## 2022-06-25 LAB — LIPID PANEL
Chol/HDL Ratio: 2.4 ratio (ref 0.0–4.4)
Cholesterol, Total: 122 mg/dL (ref 100–199)
HDL: 51 mg/dL (ref >39)
LDL Chol Calc (NIH): 42 mg/dL (ref 0–99)
Triglycerides: 177 mg/dL — ABNORMAL HIGH (ref 0–149)
VLDL Cholesterol Cal: 29 mg/dL (ref 5–40)

## 2022-06-26 LAB — MICROALBUMIN / CREATININE URINE RATIO
Creatinine, Urine: 151.5 mg/dL
Microalb/Creat Ratio: 15 mg/g creat (ref 0–29)
Microalbumin, Urine: 23.1 ug/mL

## 2022-07-03 ENCOUNTER — Other Ambulatory Visit: Payer: Self-pay | Admitting: Family Medicine

## 2022-07-03 DIAGNOSIS — E1169 Type 2 diabetes mellitus with other specified complication: Secondary | ICD-10-CM

## 2022-08-13 ENCOUNTER — Other Ambulatory Visit: Payer: Self-pay | Admitting: Family Medicine

## 2022-08-15 ENCOUNTER — Other Ambulatory Visit: Payer: Self-pay | Admitting: Family Medicine

## 2022-08-24 ENCOUNTER — Other Ambulatory Visit: Payer: Self-pay | Admitting: Family Medicine

## 2022-08-24 DIAGNOSIS — I152 Hypertension secondary to endocrine disorders: Secondary | ICD-10-CM

## 2022-09-14 ENCOUNTER — Other Ambulatory Visit: Payer: Self-pay | Admitting: Family Medicine

## 2022-09-14 DIAGNOSIS — E1169 Type 2 diabetes mellitus with other specified complication: Secondary | ICD-10-CM

## 2022-09-14 MED ORDER — OZEMPIC (0.25 OR 0.5 MG/DOSE) 2 MG/3ML ~~LOC~~ SOPN: 0.5000 mg | PEN_INJECTOR | SUBCUTANEOUS | 0 refills | Status: DC

## 2022-09-23 ENCOUNTER — Telehealth: Payer: Self-pay | Admitting: Family Medicine

## 2022-09-23 MED ORDER — METOPROLOL SUCCINATE ER 50 MG PO TB24: 50.0000 mg | ORAL_TABLET | Freq: Every day | ORAL | 0 refills | Status: DC

## 2022-09-23 NOTE — Telephone Encounter (Signed)
Daughter is asking that all her rx be sent in for 90 day supply  Semaglutide,0.25 or 0.'5MG'$ /DOS, (OZEMPIC, 0.25 OR 0.5 MG/DOSE,) 2 MG/3ML SOPN  metoprolol succinate (TOPROL-XL) 50 MG 24 hr tablet  lisinopril (ZESTRIL) 10 MG tablet  atorvastatin (LIPITOR) 80 MG tablet  Use [facility] in [place]

## 2022-09-23 NOTE — Telephone Encounter (Signed)
Daughter aware Metoprolol was sent to pharmacy, other requested meds had refills. 3 mos FU appt was made today for 10/12/22

## 2022-10-12 ENCOUNTER — Ambulatory Visit (INDEPENDENT_AMBULATORY_CARE_PROVIDER_SITE_OTHER): Payer: Medicare HMO | Admitting: Family Medicine

## 2022-10-12 ENCOUNTER — Encounter: Payer: Self-pay | Admitting: Family Medicine

## 2022-10-12 VITALS — BP 133/78 | HR 75 | Temp 98.0°F | Ht 65.0 in | Wt 224.6 lb

## 2022-10-12 DIAGNOSIS — E785 Hyperlipidemia, unspecified: Secondary | ICD-10-CM

## 2022-10-12 DIAGNOSIS — E1169 Type 2 diabetes mellitus with other specified complication: Secondary | ICD-10-CM

## 2022-10-12 DIAGNOSIS — J301 Allergic rhinitis due to pollen: Secondary | ICD-10-CM

## 2022-10-12 DIAGNOSIS — R7989 Other specified abnormal findings of blood chemistry: Secondary | ICD-10-CM | POA: Diagnosis not present

## 2022-10-12 DIAGNOSIS — J309 Allergic rhinitis, unspecified: Secondary | ICD-10-CM | POA: Insufficient documentation

## 2022-10-12 LAB — BAYER DCA HB A1C WAIVED: HB A1C (BAYER DCA - WAIVED): 7 % — ABNORMAL HIGH (ref 4.8–5.6)

## 2022-10-12 MED ORDER — LEVOCETIRIZINE DIHYDROCHLORIDE 5 MG PO TABS: 5.0000 mg | ORAL_TABLET | Freq: Every evening | ORAL | 1 refills | Status: DC

## 2022-10-12 MED ORDER — SEMAGLUTIDE (1 MG/DOSE) 4 MG/3ML ~~LOC~~ SOPN: 1.0000 mg | PEN_INJECTOR | SUBCUTANEOUS | 3 refills | Status: DC

## 2022-10-12 NOTE — Progress Notes (Signed)
Subjective:  Patient ID: Kristina Peterson, female    DOB: 07-26-1945, 77 y.o.   MRN: [id number]  Patient Care Team: Baruch Gouty, FNP as PCP - General (Family Medicine) [place]   Chief Complaint:  Diabetes (3 month follow up )   HPI: Kristina Peterson is a 77 y.o. female presenting on 10/12/2022 for Diabetes (3 month follow up )   1. Elevated TSH TSH has been elevated in the past. Denies hyper- or hypothyroid symptoms. No hair, skin, or nail changes. No bowel habit changes. No significant weight changes.   2. Hyperlipidemia associated with type 2 diabetes mellitus ([place]) Compliant with medications - Yes Current medications - atorvastin Side effects from medications - No Diet - generally healthy Exercise - not regular, active daily  3. Type 2 diabetes mellitus with other specified complication, without long-term current use of insulin (HCC) Pt is on Ozempic 0.5 mg weekly and has been tolerating well. Slight nausea on day of dosing but then subsides. She does try to follow a healthy diet. No regular exercise routine but is active daily. She denies polyuria, polydipsia, or polyphagia.   4. Rhinorrhea  Reports over the last few weeks she has had rhinorrhea with cough and postnasal drip. No other associated symptoms. She has not tried anything for her symptoms.      Relevant past medical, surgical, family, and social history reviewed and updated as indicated.  Allergies and medications reviewed and updated. Data reviewed: Chart in Epic.   Past Medical History:  Diagnosis Date   Diabetes mellitus without complication ([place])    Hypertension     Past Surgical History:  Procedure Laterality Date   DILATION AND CURETTAGE OF UTERUS     HYSTEROSCOPY WITH D & C N/A 02/13/2015   Procedure: HYSTEROSCOPY, UTERINE CURETTAGE;  Surgeon: Florian Buff, MD;  Location: [facility];  Service: Gynecology;  Laterality: N/A;    Social History   Socioeconomic History   Marital status:  Married    Spouse name: Not on file   Number of children: Not on file   Years of education: Not on file   Highest education level: Not on file  Occupational History   Not on file  Tobacco Use   Smoking status: Never   Smokeless tobacco: Never  Vaping Use   Vaping Use: Never used  Substance and Sexual Activity   Alcohol use: No    Alcohol/week: 0.0 standard drinks of alcohol   Drug use: No   Sexual activity: Not Currently  Other Topics Concern   Not on file  Social History Narrative   Not on file   Social Determinants of Health   Financial Resource Strain: Not on file  Food Insecurity: Not on file  Transportation Needs: Not on file  Physical Activity: Not on file  Stress: Not on file  Social Connections: Not on file  Intimate Partner Violence: Not on file    Outpatient Encounter Medications as of 10/12/2022  Medication Sig   aspirin EC 81 MG tablet Take 81 mg by mouth daily.   atorvastatin (LIPITOR) 80 MG tablet TAKE 1 TABLET BY MOUTH AT BEDTIME   Cholecalciferol 50 MCG (2000 UT) CAPS Take by mouth.   folic acid (FOLVITE) 1 MG tablet Take by mouth.   ibuprofen (ADVIL) 200 MG tablet Take by mouth.   levocetirizine (XYZAL) 5 MG tablet Take 1 tablet (5 mg total) by mouth every evening.   lisinopril (ZESTRIL) 10 MG  tablet Take 1 tablet by mouth once daily   metoprolol succinate (TOPROL-XL) 50 MG 24 hr tablet Take 1 tablet (50 mg total) by mouth daily.   omega-3 fish oil (MAXEPA) 1000 MG CAPS capsule Take 1 capsule by mouth daily.   omeprazole (PRILOSEC) 20 MG capsule Take 20 mg by mouth daily.   Semaglutide, 1 MG/DOSE, 4 MG/3ML SOPN Inject 1 mg as directed once a week.   [DISCONTINUED] Semaglutide,0.25 or 0.5MG /DOS, (OZEMPIC, 0.25 OR 0.5 MG/DOSE,) 2 MG/3ML SOPN Inject 0.5 mg into the skin once a week.   No facility-administered encounter medications on file as of 10/12/2022.    No Known Allergies  Review of Systems  Constitutional:  Negative for activity change,  appetite change, chills, diaphoresis, fatigue, fever and unexpected weight change.  HENT:  Positive for congestion, postnasal drip and rhinorrhea. Negative for sinus pressure, sinus pain, sneezing, sore throat, tinnitus, trouble swallowing and voice change.   Eyes: Negative.  Negative for photophobia and visual disturbance.  Respiratory:  Positive for cough. Negative for apnea, choking, chest tightness, shortness of breath, wheezing and stridor.   Cardiovascular:  Negative for chest pain, palpitations and leg swelling.  Gastrointestinal:  Negative for abdominal pain, blood in stool, constipation, diarrhea, nausea and vomiting.  Endocrine: Negative.  Negative for cold intolerance, heat intolerance, polydipsia, polyphagia and polyuria.  Genitourinary:  Negative for decreased urine volume, difficulty urinating, dysuria, frequency and urgency.  Musculoskeletal:  Negative for arthralgias and myalgias.  Skin: Negative.   Allergic/Immunologic: Negative.   Neurological:  Negative for dizziness, tremors, seizures, syncope, facial asymmetry, speech difficulty, weakness, light-headedness, numbness and headaches.  Hematological: Negative.   Psychiatric/Behavioral:  Negative for confusion, hallucinations, sleep disturbance and suicidal ideas.   All other systems reviewed and are negative.       Objective:  BP 133/78   Pulse 75   Temp 98 F (36.7 C) (Temporal)   Ht 5\' 5"  (1.651 m)   Wt 224 lb 9.6 oz (101.9 kg)   SpO2 91%   BMI 37.38 kg/m    Wt Readings from Last 3 Encounters:  10/12/22 224 lb 9.6 oz (101.9 kg)  06/24/22 222 lb 12.8 oz (101.1 kg)  01/28/22 224 lb (101.6 kg)    Physical Exam Vitals and nursing note reviewed.  Constitutional:      General: She is not in acute distress.    Appearance: Normal appearance. She is well-developed and well-groomed. She is morbidly obese. She is not ill-appearing, toxic-appearing or diaphoretic.  HENT:     Head: Normocephalic and atraumatic.      Jaw: There is normal jaw occlusion.     Right Ear: Hearing normal.     Left Ear: Hearing normal.     Nose: Congestion and rhinorrhea present.     Mouth/Throat:     Lips: Pink.     Mouth: Mucous membranes are moist.     Pharynx: Oropharynx is clear. Uvula midline.  Eyes:     General: Lids are normal.     Conjunctiva/sclera: Conjunctivae normal.     Pupils: Pupils are equal, round, and reactive to light.     Comments: Hooded eyes  Neck:     Thyroid: No thyroid mass, thyromegaly or thyroid tenderness.     Vascular: No carotid bruit or JVD.     Trachea: Trachea and phonation normal.  Cardiovascular:     Rate and Rhythm: Normal rate and regular rhythm.     Chest Wall: PMI is not displaced.  Pulses: Normal pulses.     Heart sounds: Normal heart sounds. No murmur heard.    No friction rub. No gallop.  Pulmonary:     Effort: Pulmonary effort is normal. No respiratory distress.     Breath sounds: Normal breath sounds. No wheezing.  Abdominal:     General: There is no abdominal bruit.     Palpations: There is no hepatomegaly or splenomegaly.  Musculoskeletal:        General: Normal range of motion.     Cervical back: Normal range of motion and neck supple.     Right lower leg: No edema.     Left lower leg: No edema.  Lymphadenopathy:     Cervical: No cervical adenopathy.  Skin:    General: Skin is warm and dry.     Capillary Refill: Capillary refill takes less than 2 seconds.     Coloration: Skin is not cyanotic, jaundiced or pale.     Findings: No rash.  Neurological:     General: No focal deficit present.     Mental Status: She is alert and oriented to person, place, and time.     Sensory: Sensation is intact.     Motor: Motor function is intact.     Coordination: Coordination is intact.     Gait: Gait is intact.     Deep Tendon Reflexes: Reflexes are normal and symmetric.  Psychiatric:        Attention and Perception: Attention and perception normal.        Mood and  Affect: Mood and affect normal.        Speech: Speech normal.        Behavior: Behavior normal. Behavior is cooperative.        Thought Content: Thought content normal.        Cognition and Memory: Cognition and memory normal.        Judgment: Judgment normal.     Results for orders placed or performed in visit on 10/12/22  Bayer DCA Hb A1c Waived  Result Value Ref Range   HB A1C (BAYER DCA - WAIVED) 7.0 (H) 4.8 - 5.6 %       Pertinent labs & imaging results that were available during my care of the patient were reviewed by me and considered in my medical decision making.  Assessment & Plan:  Jalia was seen today for diabetes.  Diagnoses and all orders for this visit:  Type 2 diabetes mellitus with other specified complication, without long-term current use of insulin (HCC) A1C 7.0. Will increase ozempic to 1 mg for better A1C control. Diet and exercise encouraged. Has appointment with Dr. Lind Covert for eye exam, will fax results.  -     CMP14+EGFR -     Bayer DCA Hb A1c Waived -     Semaglutide, 1 MG/DOSE, 4 MG/3ML SOPN; Inject 1 mg as directed once a week.  Elevated TSH Will recheck labs today. Further treatment pending results.  -     Thyroid Panel With TSH  Hyperlipidemia associated with type 2 diabetes mellitus ([place]) Diet encouraged - increase intake of fresh fruits and vegetables, increase intake of lean proteins. Bake, broil, or grill foods. Avoid fried, greasy, and fatty foods. Avoid fast foods. Increase intake of fiber-rich whole grains. Exercise encouraged - at least 150 minutes per week and advance as tolerated.  Goal BMI < 25. Continue medications as prescribed. Follow up in 3-6 months as discussed.  -     CMP14+EGFR -  Lipid panel  Seasonal allergic rhinitis due to pollen Will trial below. Symptomatic care discussed in detail. Can trail Flonase to see if beneficial. Aware to report new, worsening, or persistent symptoms.  -     levocetirizine (XYZAL) 5 MG tablet;  Take 1 tablet (5 mg total) by mouth every evening.     Continue all other maintenance medications.  Follow up plan: Return in 3 months (on 01/12/2023), or if symptoms worsen or fail to improve, for DM.   Continue healthy lifestyle choices, including diet (rich in fruits, vegetables, and lean proteins, and low in salt and simple carbohydrates) and exercise (at least 30 minutes of moderate physical activity daily).  Educational handout given for DM  The above assessment and management plan was discussed with the patient. The patient verbalized understanding of and has agreed to the management plan. Patient is aware to call the clinic if they develop any new symptoms or if symptoms persist or worsen. Patient is aware when to return to the clinic for a follow-up visit. Patient educated on when it is appropriate to go to the emergency department.   Monia Pouch, FNP-C [facility] [phone]

## 2022-10-12 NOTE — Patient Instructions (Addendum)

## 2022-10-13 LAB — CMP14+EGFR
ALT: 19 IU/L (ref 0–32)
AST: 18 IU/L (ref 0–40)
Albumin/Globulin Ratio: 1.6 (ref 1.2–2.2)
Albumin: 4.2 g/dL (ref 3.8–4.8)
Alkaline Phosphatase: 100 IU/L (ref 44–121)
BUN/Creatinine Ratio: 13 (ref 12–28)
BUN: 17 mg/dL (ref 8–27)
Bilirubin Total: 0.5 mg/dL (ref 0.0–1.2)
CO2: 21 mmol/L (ref 20–29)
Calcium: 9.9 mg/dL (ref 8.7–10.3)
Chloride: 99 mmol/L (ref 96–106)
Creatinine, Ser: 1.34 mg/dL — ABNORMAL HIGH (ref 0.57–1.00)
Globulin, Total: 2.6 g/dL (ref 1.5–4.5)
Glucose: 148 mg/dL — ABNORMAL HIGH (ref 70–99)
Potassium: 4.7 mmol/L (ref 3.5–5.2)
Sodium: 136 mmol/L (ref 134–144)
Total Protein: 6.8 g/dL (ref 6.0–8.5)
eGFR: 41 mL/min/{1.73_m2} — ABNORMAL LOW (ref >59)

## 2022-10-13 LAB — THYROID PANEL WITH TSH
Free Thyroxine Index: 2.4 (ref 1.2–4.9)
T3 Uptake Ratio: 29 % (ref 24–39)
T4, Total: 8.3 ug/dL (ref 4.5–12.0)
TSH: 3.26 u[IU]/mL (ref 0.450–4.500)

## 2022-10-13 LAB — LIPID PANEL
Chol/HDL Ratio: 2.6 ratio (ref 0.0–4.4)
Cholesterol, Total: 128 mg/dL (ref 100–199)
HDL: 49 mg/dL (ref >39)
LDL Chol Calc (NIH): 52 mg/dL (ref 0–99)
Triglycerides: 164 mg/dL — ABNORMAL HIGH (ref 0–149)
VLDL Cholesterol Cal: 27 mg/dL (ref 5–40)

## 2022-11-24 ENCOUNTER — Telehealth: Payer: Self-pay | Admitting: Family Medicine

## 2022-11-24 NOTE — Telephone Encounter (Signed)
Contacted Bishop Limbo to schedule their annual wellness visit. Appointment made for 12/07/2022.  [facility] Care Guide [facility] AWV TEAM Direct Dial: [phone]

## 2022-12-13 ENCOUNTER — Ambulatory Visit (INDEPENDENT_AMBULATORY_CARE_PROVIDER_SITE_OTHER): Payer: Medicare HMO

## 2022-12-13 DIAGNOSIS — Z23 Encounter for immunization: Secondary | ICD-10-CM

## 2022-12-13 DIAGNOSIS — Z0000 Encounter for general adult medical examination without abnormal findings: Secondary | ICD-10-CM | POA: Diagnosis not present

## 2022-12-13 NOTE — Patient Instructions (Addendum)
MEDICARE ANNUAL WELLNESS VISIT Health Maintenance Summary and Written Plan of Care  Kristina Peterson ,  Thank you for allowing me to perform your Medicare Annual Wellness Visit and for your ongoing commitment to your health.   Health Maintenance & Immunization History Health Maintenance  Topic Date Due  . COVID-19 Vaccine (2 - Moderna risk series) 08/13/2020  . Zoster Vaccines- Shingrix (1 of 2) 01/12/2023 (Originally 09/04/1964)  . Pneumonia Vaccine 59+ Years old (1 of 1 - PCV) 10/12/2023 (Originally 09/04/2010)  . Hepatitis C Screening  10/12/2023 (Originally 09/05/1963)  . INFLUENZA VACCINE  02/23/2023  . HEMOGLOBIN A1C  04/14/2023  . Diabetic kidney evaluation - Urine ACR  06/25/2023  . Diabetic kidney evaluation - eGFR measurement  10/12/2023  . FOOT EXAM  10/12/2023  . OPHTHALMOLOGY EXAM  11/28/2023  . Medicare Annual Wellness (AWV)  12/13/2023  . DTaP/Tdap/Td (2 - Td or Tdap) 06/24/2032  . DEXA SCAN  Completed  . HPV VACCINES  Aged Out   Immunization History  Administered Date(s) Administered  . MODERNA COVID-19 SARS-COV-2 PEDS BIVALENT BOOSTER 6Y-11Y 06/26/2020  . Moderna Sars-Covid-2 Vaccination 07/16/2020  . Tdap 06/24/2022    These are the patient goals that we discussed:  Goals Addressed            This Visit's Progress   . Patient Stated   Not on track             04/26/2022 AWV Goal: Exercise for General Health  Patient will verbalize understanding of the benefits of increased physical activity: Exercising regularly is important. It will improve your overall fitness, flexibility, and endurance. Regular exercise also will improve your overall health. It can help you control your weight, reduce stress, and improve your bone density. Over the next year, patient will increase physical activity as tolerated with a goal of at least 150 minutes of moderate physical activity per week.  You can tell that you are exercising at a moderate intensity if your heart  starts beating faster and you start breathing faster but can still hold a conversation. Moderate-intensity exercise ideas include: Walking 1 mile (1.6 km) in about 15 minutes Biking Hiking Golfing Dancing Water aerobics Patient will verbalize understanding of everyday activities that increase physical activity by providing examples like the following: Yard work, such as: Insurance underwriter Gardening Washing windows or floors Patient will be able to explain general safety guidelines for exercising:  Before you start a new exercise program, talk with your health care provider. Do not exercise so much that you hurt yourself, feel dizzy, or get very short of breath. Wear comfortable clothes and wear shoes with good support. Drink plenty of water while you exercise to prevent dehydration or heat stroke. Work out until your breathing and your heartbeat get faster.                          This is a list of Health Maintenance Items that are overdue or due now: Health Maintenance Due  Topic Date Due  . COVID-19 Vaccine (2 - Moderna risk series) 08/13/2020     Orders/Referrals Placed Today: No orders of the defined types were placed in this encounter.  (Contact our referral department at [phone] if you have not spoken with someone about your referral appointment within the next 5 days)    Follow-up Plan  Next appointment:  01/18/23 at 3:05pm  with Kari Baars, FNP      Health Maintenance, Female Adopting a healthy lifestyle and getting preventive care are important in promoting health and wellness. Ask your health care provider about: The right schedule for you to have regular tests and exams. Things you can do on your own to prevent diseases and keep yourself healthy. What should I know about diet, weight, and exercise? Eat a healthy diet  Eat a diet that includes plenty  of vegetables, fruits, low-fat dairy products, and lean protein. Do not eat a lot of foods that are high in solid fats, added sugars, or sodium. Maintain a healthy weight Body mass index (BMI) is used to identify weight problems. It estimates body fat based on height and weight. Your health care provider can help determine your BMI and help you achieve or maintain a healthy weight. Get regular exercise Get regular exercise. This is one of the most important things you can do for your health. Most adults should: Exercise for at least 150 minutes each week. The exercise should increase your heart rate and make you sweat (moderate-intensity exercise). Do strengthening exercises at least twice a week. This is in addition to the moderate-intensity exercise. Spend less time sitting. Even light physical activity can be beneficial. Watch cholesterol and blood lipids Have your blood tested for lipids and cholesterol at 77 years of age, then have this test every 5 years. Have your cholesterol levels checked more often if: Your lipid or cholesterol levels are high. You are older than 77 years of age. You are at high risk for heart disease. What should I know about cancer screening? Depending on your health history and family history, you may need to have cancer screening at various ages. This may include screening for: Breast cancer. Cervical cancer. Colorectal cancer. Skin cancer. Lung cancer. What should I know about heart disease, diabetes, and high blood pressure? Blood pressure and heart disease High blood pressure causes heart disease and increases the risk of stroke. This is more likely to develop in people who have high blood pressure readings or are overweight. Have your blood pressure checked: Every 3-5 years if you are 52-81 years of age. Every year if you are 80 years old or older. Diabetes Have regular diabetes screenings. This checks your fasting blood sugar level. Have the screening  done: Once every three years after age 64 if you are at a normal weight and have a low risk for diabetes. More often and at a younger age if you are overweight or have a high risk for diabetes. What should I know about preventing infection? Hepatitis B If you have a higher risk for hepatitis B, you should be screened for this virus. Talk with your health care provider to find out if you are at risk for hepatitis B infection. Hepatitis C Testing is recommended for: Everyone born from [birth year] through 1965. Anyone with known risk factors for hepatitis C. Sexually transmitted infections (STIs) Get screened for STIs, including gonorrhea and chlamydia, if: You are sexually active and are younger than 77 years of age. You are older than 77 years of age and your health care provider tells you that you are at risk for this type of infection. Your sexual activity has changed since you were last screened, and you are at increased risk for chlamydia or gonorrhea. Ask your health care provider if you are at risk. Ask your health care provider about whether you are at high risk for HIV. Your health care  provider may recommend a prescription medicine to help prevent HIV infection. If you choose to take medicine to prevent HIV, you should first get tested for HIV. You should then be tested every 3 months for as long as you are taking the medicine. Pregnancy If you are about to stop having your period (premenopausal) and you may become pregnant, seek counseling before you get pregnant. Take 400 to 800 micrograms (mcg) of folic acid every day if you become pregnant. Ask for birth control (contraception) if you want to prevent pregnancy. Osteoporosis and menopause Osteoporosis is a disease in which the bones lose minerals and strength with aging. This can result in bone fractures. If you are 49 years old or older, or if you are at risk for osteoporosis and fractures, ask your health care provider if you should: Be  screened for bone loss. Take a calcium or vitamin D supplement to lower your risk of fractures. Be given hormone replacement therapy (HRT) to treat symptoms of menopause. Follow these instructions at home: Alcohol use Do not drink alcohol if: Your health care provider tells you not to drink. You are pregnant, may be pregnant, or are planning to become pregnant. If you drink alcohol: Limit how much you have to: 0-1 drink a day. Know how much alcohol is in your drink. In the [place] one drink equals one 12 oz bottle of beer (355 mL), one 5 oz glass of wine (148 mL), or one 1 oz glass of hard liquor (44 mL). Lifestyle Do not use any products that contain nicotine or tobacco. These products include cigarettes, chewing tobacco, and vaping devices, such as e-cigarettes. If you need help quitting, ask your health care provider. Do not use street drugs. Do not share needles. Ask your health care provider for help if you need support or information about quitting drugs. General instructions Schedule regular health, dental, and eye exams. Stay current with your vaccines. Tell your health care provider if: You often feel depressed. You have ever been abused or do not feel safe at home. Summary Adopting a healthy lifestyle and getting preventive care are important in promoting health and wellness. Follow your health care provider's instructions about healthy diet, exercising, and getting tested or screened for diseases. Follow your health care provider's instructions on monitoring your cholesterol and blood pressure. This information is not intended to replace advice given to you by your health care provider. Make sure you discuss any questions you have with your health care provider. Document Revised: 11/30/2020 Document Reviewed: 11/30/2020 Elsevier Patient Education  2023 [employer].

## 2022-12-13 NOTE — Progress Notes (Signed)
MEDICARE ANNUAL WELLNESS VISIT  12/13/2022  Telephone Visit Disclaimer This Medicare AWV was conducted by telephone due to national recommendations for restrictions regarding the COVID-19 Pandemic (e.g. social distancing).  I verified, using two identifiers, that I am speaking with Kristina Peterson or their authorized healthcare agent. I discussed the limitations, risks, security, and privacy concerns of performing an evaluation and management service by telephone and the potential availability of an in-person appointment in the future. The patient expressed understanding and agreed to proceed.  Location of Patient: Home Location of Provider (nurse):  [facility]  Subjective:    Kristina Peterson is a 76 y.o. female patient of Rakes, Doralee Albino, FNP who had a Medicare Annual Wellness Visit today via telephone. Kristina Peterson is Retired and lives with their family. she has 4 children. she reports that she is socially active and does interact with friends/family regularly. she is not physically active and enjoys gardening.  Patient Care Team: Sonny Masters, FNP as PCP - General (Family Medicine) [facility]     12/13/2022   11:19 AM 04/26/2022   11:15 AM 02/11/2015    1:47 PM 12/03/2014   11:25 AM  Advanced Directives  Does Patient Have a Medical Advance Directive? Yes Yes No No  Type of Estate agent of [place];Living will Healthcare Power of [place];Living will    Does patient want to make changes to medical advance directive? No - Patient declined No - Patient declined No - Patient declined   Copy of Healthcare Power of Attorney in Chart? No - copy requested No - copy requested    Would patient like information on creating a medical advance directive?    No - patient declined information    Hospital Utilization Over the Past 12 Months: # of hospitalizations or ER visits: 0 # of surgeries: 1  Review of Systems    Patient reports that her  overall health is unchanged compared to last year.  Patient Reported Readings (BP, Pulse, CBG, Weight, etc) CBG- 140  Pain Assessment Pain : No/denies pain     Current Medications & Allergies (verified) Allergies as of 12/13/2022   No Known Allergies      Medication List        Accurate as of Dec 13, 2022 11:39 AM. If you have any questions, ask your nurse or doctor.          aspirin EC 81 MG tablet Take 81 mg by mouth daily.   atorvastatin 80 MG tablet Commonly known as: LIPITOR TAKE 1 TABLET BY MOUTH AT BEDTIME   Cholecalciferol 50 MCG (2000 UT) Caps Take by mouth.   folic acid 1 MG tablet Commonly known as: FOLVITE Take by mouth.   ibuprofen 200 MG tablet Commonly known as: ADVIL Take by mouth.   levocetirizine 5 MG tablet Commonly known as: XYZAL Take 1 tablet (5 mg total) by mouth every evening.   lisinopril 10 MG tablet Commonly known as: ZESTRIL Take 1 tablet by mouth once daily   metoprolol succinate 50 MG 24 hr tablet Commonly known as: TOPROL-XL Take 1 tablet (50 mg total) by mouth daily.   omega-3 fish oil 1000 MG Caps capsule Commonly known as: MAXEPA Take 1 capsule by mouth daily.   omeprazole 20 MG capsule Commonly known as: PRILOSEC Take 20 mg by mouth daily.   Semaglutide (1 MG/DOSE) 4 MG/3ML Sopn Inject 1 mg as directed once a week.  History (reviewed): Past Medical History:  Diagnosis Date   Diabetes mellitus without complication (HCC)    Hypertension    Past Surgical History:  Procedure Laterality Date   DILATION AND CURETTAGE OF UTERUS     HYSTEROSCOPY WITH D & C N/A 02/13/2015   Procedure: HYSTEROSCOPY, UTERINE CURETTAGE;  Surgeon: Lazaro Arms, MD;  Location: [facility];  Service: Gynecology;  Laterality: N/A;   Family History  Problem Relation Age of Onset   Cancer Mother        cervical   Cancer Maternal Aunt        lung   Cancer Maternal Uncle    Diabetes Maternal Grandmother    Heart disease Paternal  Grandfather    Social History   Socioeconomic History   Marital status: Married    Spouse name: Not on file   Number of children: Not on file   Years of education: Not on file   Highest education level: Not on file  Occupational History   Not on file  Tobacco Use   Smoking status: Never   Smokeless tobacco: Never  Vaping Use   Vaping Use: Never used  Substance and Sexual Activity   Alcohol use: No    Alcohol/week: 0.0 standard drinks of alcohol   Drug use: No   Sexual activity: Not Currently  Other Topics Concern   Not on file  Social History Narrative   Not on file   Social Determinants of Health   Financial Resource Strain: Low Risk  (12/13/2022)   Overall Financial Resource Strain (CARDIA)    Difficulty of Paying Living Expenses: Not hard at all  Food Insecurity: No Food Insecurity (12/13/2022)   Hunger Vital Sign    Worried About Running Out of Food in the Last Year: Never true    Ran Out of Food in the Last Year: Never true  Transportation Needs: No Transportation Needs (12/13/2022)   PRAPARE - Administrator, Civil Service (Medical): No    Lack of Transportation (Non-Medical): No  Physical Activity: Insufficiently Active (12/13/2022)   Exercise Vital Sign    Days of Exercise per Week: 1 day    Minutes of Exercise per Session: 10 min  Stress: No Stress Concern Present (12/13/2022)   [employer] of Occupational Health - Occupational Stress Questionnaire    Feeling of Stress : Only a little  Social Connections: Moderately Integrated (12/13/2022)   Social Connection and Isolation Panel [NHANES]    Frequency of Communication with Friends and Family: More than three times a week    Frequency of Social Gatherings with Friends and Family: More than three times a week    Attends Religious Services: 1 to 4 times per year    Active Member of [employer] or Organizations: No    Attends Banker Meetings: Never    Marital Status: Married     Activities of Daily Living    12/13/2022   11:20 AM 04/26/2022   11:16 AM  In your present state of health, do you have any difficulty performing the following activities:  Hearing? 0 0  Vision? 1 0  Difficulty concentrating or making decisions? 0 0  Walking or climbing stairs? 0 0  Dressing or bathing? 0 0  Doing errands, shopping? 0 0  Preparing Food and eating ? N N  Using the Toilet? N N  In the past six months, have you accidently leaked urine? N N  Do you have problems with loss of bowel  control? N N  Managing your Medications? N N  Managing your Finances? N N  Housekeeping or managing your Housekeeping? N N    Patient Education/ Literacy How often do you need to have someone help you when you read instructions, pamphlets, or other written materials from your doctor or pharmacy?: 2 - Rarely What is the last grade level you completed in school?: 12th grade  Exercise Current Exercise Habits: Home exercise routine, Type of exercise: walking, Time (Minutes): 15, Frequency (Times/Week): 1, Weekly Exercise (Minutes/Week): 15, Intensity: Mild  Diet Patient reports consuming 2 meals a day and 2 snack(s) a day Patient reports that her primary diet is: Diabetic Patient reports that she does have regular access to food.   Depression Screen    12/13/2022   11:15 AM 10/12/2022    3:06 PM 06/24/2022    9:51 AM 04/26/2022   11:28 AM 01/28/2022   10:15 AM 10/22/2021   10:11 AM 08/24/2021   10:04 AM  PHQ 2/9 Scores  PHQ - 2 Score 1 0 1 0 1 1 2   PHQ- 9 Score  2 4  3 3 6      Fall Risk    12/13/2022   11:17 AM 10/12/2022    3:06 PM 06/24/2022    9:51 AM 04/26/2022   11:27 AM 01/28/2022   10:15 AM  Fall Risk   Falls in the past year? 0 0 0 0 0  Follow up    Falls evaluation completed      Objective:  Kristina Peterson seemed alert and oriented and she participated appropriately during our telephone visit.  Blood Pressure Weight BMI  BP Readings from Last 3 Encounters:  10/12/22  133/78  06/24/22 137/82  01/28/22 132/73   Wt Readings from Last 3 Encounters:  10/12/22 224 lb 9.6 oz (101.9 kg)  06/24/22 222 lb 12.8 oz (101.1 kg)  01/28/22 224 lb (101.6 kg)   BMI Readings from Last 1 Encounters:  10/12/22 37.38 kg/m    *Unable to obtain current vital signs, weight, and BMI due to telephone visit type  Hearing/Vision  Kristina Peterson did not seem to have difficulty with hearing/understanding during the telephone conversation Reports that she has had a formal eye exam by an eye care professional within the past year Reports that she has not had a formal hearing evaluation within the past year *Unable to fully assess hearing and vision during telephone visit type  Cognitive Function:    12/13/2022   11:22 AM 04/26/2022   11:18 AM  6CIT Screen  What Year? 0 points 0 points  What month? 0 points 0 points  What time? 0 points 0 points  Count back from 20 0 points 0 points  Months in reverse 2 points 0 points  Repeat phrase 0 points 0 points  Total Score 2 points 0 points   (Normal:0-7, Significant for Dysfunction: >8)  Normal Cognitive Function Screening: Yes   Immunization & Health Maintenance Record Immunization History  Administered Date(s) Administered   MODERNA COVID-19 SARS-COV-2 PEDS BIVALENT BOOSTER 6Y-11Y 06/26/2020   Moderna Sars-Covid-2 Vaccination 07/16/2020   Tdap 06/24/2022    Health Maintenance  Topic Date Due   COVID-19 Vaccine (2 - Moderna risk series) 08/13/2020   Zoster Vaccines- Shingrix (1 of 2) 01/12/2023 (Originally 09/04/1964)   Pneumonia Vaccine 50+ Years old (1 of 1 - PCV) 10/12/2023 (Originally 09/04/2010)   Hepatitis C Screening  10/12/2023 (Originally 09/05/1963)   INFLUENZA VACCINE  02/23/2023   HEMOGLOBIN A1C  04/14/2023  Diabetic kidney evaluation - Urine ACR  06/25/2023   Diabetic kidney evaluation - eGFR measurement  10/12/2023   FOOT EXAM  10/12/2023   OPHTHALMOLOGY EXAM  11/28/2023   Medicare Annual Wellness (AWV)   12/13/2023   DTaP/Tdap/Td (2 - Td or Tdap) 06/24/2032   DEXA SCAN  Completed   HPV VACCINES  Aged Out       Assessment  This is a routine wellness examination for Kristina Peterson.  Health Maintenance: Due or Overdue Health Maintenance Due  Topic Date Due   COVID-19 Vaccine (2 - Moderna risk series) 08/13/2020    Kristina Peterson does not need a referral for Community Assistance: Care Management:   no Social Work:    no Prescription Assistance:  no Nutrition/Diabetes Education:  no   Plan:  Personalized Goals  Goals Addressed             This Visit's Progress    Patient Stated   Not on track           04/26/2022 AWV Goal: Exercise for General Health  Patient will verbalize understanding of the benefits of increased physical activity: Exercising regularly is important. It will improve your overall fitness, flexibility, and endurance. Regular exercise also will improve your overall health. It can help you control your weight, reduce stress, and improve your bone density. Over the next year, patient will increase physical activity as tolerated with a goal of at least 150 minutes of moderate physical activity per week.  You can tell that you are exercising at a moderate intensity if your heart starts beating faster and you start breathing faster but can still hold a conversation. Moderate-intensity exercise ideas include: Walking 1 mile (1.6 km) in about 15 minutes Biking Hiking Golfing Dancing Water aerobics Patient will verbalize understanding of everyday activities that increase physical activity by providing examples like the following: Yard work, such as: Insurance underwriter Gardening Washing windows or floors Patient will be able to explain general safety guidelines for exercising:  Before you start a new exercise program, talk with your health care provider. Do not exercise so much  that you hurt yourself, feel dizzy, or get very short of breath. Wear comfortable clothes and wear shoes with good support. Drink plenty of water while you exercise to prevent dehydration or heat stroke. Work out until your breathing and your heartbeat get faster.                         Personalized Health Maintenance & Screening Recommendations  Pneumococcal vaccine  Shingles Vaccine  Lung Cancer Screening Recommended: no (Low Dose CT Chest recommended if Age 55-80 years, 30 pack-year currently smoking OR have quit w/in past 15 years) Hepatitis C Screening recommended: NO HIV Screening recommended: {Response; No Advanced Directives: Written information was not prepared per patient's request.  Referrals & Orders No orders of the defined types were placed in this encounter.   Follow-up Plan Follow-up with Sonny Masters, FNP as planned Schedule 01/18/2023 at 3:05pm    I have personally reviewed and noted the following in the patient's chart:   Medical and social history Use of alcohol, tobacco or illicit drugs  Current medications and supplements Functional ability and status Nutritional status Physical activity Advanced directives List of other physicians Hospitalizations, surgeries, and ER visits in previous 12 months Vitals Screenings to include cognitive, depression, and falls  Referrals and appointments  In addition, I have reviewed and discussed with Kristina Peterson certain preventive protocols, quality metrics, and best practice recommendations. A written personalized care plan for preventive services as well as general preventive health recommendations is available and can be mailed to the patient at her request.      Dorene Sorrow, LPN  2/84/1324       Subjective:   Kristina Peterson is a 77 y.o. female who presents for Medicare Annual (Subsequent) preventive examination.  Review of Systems     Cardiac Risk Factors include: advanced age (>86men,  >75 women);obesity (BMI >30kg/m2);sedentary lifestyle;diabetes mellitus     Objective:    There were no vitals filed for this visit. There is no height or weight on file to calculate BMI.     12/13/2022   11:19 AM 04/26/2022   11:15 AM 02/11/2015    1:47 PM 12/03/2014   11:25 AM  Advanced Directives  Does Patient Have a Medical Advance Directive? Yes Yes No No  Type of Estate agent of [place];Living will Healthcare Power of [place];Living will    Does patient want to make changes to medical advance directive? No - Patient declined No - Patient declined No - Patient declined   Copy of Healthcare Power of Attorney in Chart? No - copy requested No - copy requested    Would patient like information on creating a medical advance directive?    No - patient declined information    Current Medications (verified) Outpatient Encounter Medications as of 12/13/2022  Medication Sig   aspirin EC 81 MG tablet Take 81 mg by mouth daily.   atorvastatin (LIPITOR) 80 MG tablet TAKE 1 TABLET BY MOUTH AT BEDTIME   Cholecalciferol 50 MCG (2000 UT) CAPS Take by mouth.   folic acid (FOLVITE) 1 MG tablet Take by mouth.   ibuprofen (ADVIL) 200 MG tablet Take by mouth.   levocetirizine (XYZAL) 5 MG tablet Take 1 tablet (5 mg total) by mouth every evening.   lisinopril (ZESTRIL) 10 MG tablet Take 1 tablet by mouth once daily   metoprolol succinate (TOPROL-XL) 50 MG 24 hr tablet Take 1 tablet (50 mg total) by mouth daily.   omega-3 fish oil (MAXEPA) 1000 MG CAPS capsule Take 1 capsule by mouth daily.   omeprazole (PRILOSEC) 20 MG capsule Take 20 mg by mouth daily.   Semaglutide, 1 MG/DOSE, 4 MG/3ML SOPN Inject 1 mg as directed once a week.   No facility-administered encounter medications on file as of 12/13/2022.    Allergies (verified) Patient has no known allergies.   History: Past Medical History:  Diagnosis Date   Diabetes mellitus without complication (HCC)    Hypertension     Past Surgical History:  Procedure Laterality Date   DILATION AND CURETTAGE OF UTERUS     HYSTEROSCOPY WITH D & C N/A 02/13/2015   Procedure: HYSTEROSCOPY, UTERINE CURETTAGE;  Surgeon: Lazaro Arms, MD;  Location: [facility];  Service: Gynecology;  Laterality: N/A;   Family History  Problem Relation Age of Onset   Cancer Mother        cervical   Cancer Maternal Aunt        lung   Cancer Maternal Uncle    Diabetes Maternal Grandmother    Heart disease Paternal Grandfather    Social History   Socioeconomic History   Marital status: Married    Spouse name: Not on file   Number of children: Not on file  Years of education: Not on file   Highest education level: Not on file  Occupational History   Not on file  Tobacco Use   Smoking status: Never   Smokeless tobacco: Never  Vaping Use   Vaping Use: Never used  Substance and Sexual Activity   Alcohol use: No    Alcohol/week: 0.0 standard drinks of alcohol   Drug use: No   Sexual activity: Not Currently  Other Topics Concern   Not on file  Social History Narrative   Not on file   Social Determinants of Health   Financial Resource Strain: Low Risk  (12/13/2022)   Overall Financial Resource Strain (CARDIA)    Difficulty of Paying Living Expenses: Not hard at all  Food Insecurity: No Food Insecurity (12/13/2022)   Hunger Vital Sign    Worried About Running Out of Food in the Last Year: Never true    Ran Out of Food in the Last Year: Never true  Transportation Needs: No Transportation Needs (12/13/2022)   PRAPARE - Administrator, Civil Service (Medical): No    Lack of Transportation (Non-Medical): No  Physical Activity: Insufficiently Active (12/13/2022)   Exercise Vital Sign    Days of Exercise per Week: 1 day    Minutes of Exercise per Session: 10 min  Stress: No Stress Concern Present (12/13/2022)   [employer] of Occupational Health - Occupational Stress Questionnaire    Feeling of Stress : Only a  little  Social Connections: Moderately Integrated (12/13/2022)   Social Connection and Isolation Panel [NHANES]    Frequency of Communication with Friends and Family: More than three times a week    Frequency of Social Gatherings with Friends and Family: More than three times a week    Attends Religious Services: 1 to 4 times per year    Active Member of [employer] or Organizations: No    Attends Engineer, structural: Never    Marital Status: Married    Tobacco Counseling Counseling given: Not Answered   Clinical Intake:  Pre-visit preparation completed: No  Pain : No/denies pain     BMI - recorded: 37 Nutritional Status: BMI > 30  Obese Diabetes: Yes CBG done?: Yes CBG resulted in Enter/ Edit results?: Yes (140) Did pt. bring in CBG monitor from home?: No  How often do you need to have someone help you when you read instructions, pamphlets, or other written materials from your doctor or pharmacy?: 2 - Rarely What is the last grade level you completed in school?: 12th grade  Diabetic? yes  Interpreter Needed?: No  Information entered by :: Dorene Sorrow, LPN   Activities of Daily Living    12/13/2022   11:20 AM 04/26/2022   11:16 AM  In your present state of health, do you have any difficulty performing the following activities:  Hearing? 0 0  Vision? 1 0  Difficulty concentrating or making decisions? 0 0  Walking or climbing stairs? 0 0  Dressing or bathing? 0 0  Doing errands, shopping? 0 0  Preparing Food and eating ? N N  Using the Toilet? N N  In the past six months, have you accidently leaked urine? N N  Do you have problems with loss of bowel control? N N  Managing your Medications? N N  Managing your Finances? N N  Housekeeping or managing your Housekeeping? N N    Patient Care Team: Sonny Masters, FNP as PCP - General (Family Medicine) [facility]  Indicate any recent Medical Services you may have received from other than [facility]  providers in the past year (date may be approximate).     Assessment:   This is a routine wellness examination for [facility].  Hearing/Vision screen No results found.  Dietary issues and exercise activities discussed: Current Exercise Habits: Home exercise routine, Type of exercise: walking, Time (Minutes): 15, Frequency (Times/Week): 1, Weekly Exercise (Minutes/Week): 15, Intensity: Mild   Goals Addressed             This Visit's Progress    Patient Stated   Not on track             04/26/2022 AWV Goal: Exercise for General Health  Patient will verbalize understanding of the benefits of increased physical activity: Exercising regularly is important. It will improve your overall fitness, flexibility, and endurance. Regular exercise also will improve your overall health. It can help you control your weight, reduce stress, and improve your bone density. Over the next year, patient will increase physical activity as tolerated with a goal of at least 150 minutes of moderate physical activity per week.  You can tell that you are exercising at a moderate intensity if your heart starts beating faster and you start breathing faster but can still hold a conversation. Moderate-intensity exercise ideas include: Walking 1 mile (1.6 km) in about 15 minutes Biking Hiking Golfing Dancing Water aerobics Patient will verbalize understanding of everyday activities that increase physical activity by providing examples like the following: Yard work, such as: Insurance underwriter Gardening Washing windows or floors Patient will be able to explain general safety guidelines for exercising:  Before you start a new exercise program, talk with your health care provider. Do not exercise so much that you hurt yourself, feel dizzy, or get very short of breath. Wear comfortable clothes and wear shoes with good  support. Drink plenty of water while you exercise to prevent dehydration or heat stroke. Work out until your breathing and your heartbeat get faster.                        Depression Screen    12/13/2022   11:15 AM 10/12/2022    3:06 PM 06/24/2022    9:51 AM 04/26/2022   11:28 AM 01/28/2022   10:15 AM 10/22/2021   10:11 AM 08/24/2021   10:04 AM  PHQ 2/9 Scores  PHQ - 2 Score 1 0 1 0 1 1 2   PHQ- 9 Score  2 4  3 3 6     Fall Risk    12/13/2022   11:17 AM 10/12/2022    3:06 PM 06/24/2022    9:51 AM 04/26/2022   11:27 AM 01/28/2022   10:15 AM  Fall Risk   Falls in the past year? 0 0 0 0 0  Follow up    Falls evaluation completed     FALL RISK PREVENTION PERTAINING TO THE HOME:  Any stairs in or around the home? No  If so, are there any without handrails?  N/A Home free of loose throw rugs in walkways, pet beds, electrical cords, etc? Yes  Adequate lighting in your home to reduce risk of falls? Yes   ASSISTIVE DEVICES UTILIZED TO PREVENT FALLS:  Life alert? No  Use of a cane, walker or w/c? Yes  Grab bars in the bathroom? Yes  Shower chair  or bench in shower? Yes  Elevated toilet seat or a handicapped toilet? No   Cognitive Function:        12/13/2022   11:22 AM 04/26/2022   11:18 AM  6CIT Screen  What Year? 0 points 0 points  What month? 0 points 0 points  What time? 0 points 0 points  Count back from 20 0 points 0 points  Months in reverse 2 points 0 points  Repeat phrase 0 points 0 points  Total Score 2 points 0 points    Immunizations Immunization History  Administered Date(s) Administered   MODERNA COVID-19 SARS-COV-2 PEDS BIVALENT BOOSTER 6Y-11Y 06/26/2020   Moderna Sars-Covid-2 Vaccination 07/16/2020   Tdap 06/24/2022    TDAP status: Up to date  Flu Vaccine status: Up to date  Pneumococcal vaccine status: Declined,  Education has been provided regarding the importance of this vaccine but patient still declined. Advised may receive this  vaccine at local pharmacy or Health Dept. Aware to provide a copy of the vaccination record if obtained from local pharmacy or Health Dept. Verbalized acceptance and understanding.   Covid-19 vaccine status: Completed vaccines  Qualifies for Shingles Vaccine? Yes   Zostavax completed No   Shingrix Completed?: No.    Education has been provided regarding the importance of this vaccine. Patient has been advised to call insurance company to determine out of pocket expense if they have not yet received this vaccine. Advised may also receive vaccine at local pharmacy or Health Dept. Verbalized acceptance and understanding.  Screening Tests Health Maintenance  Topic Date Due   COVID-19 Vaccine (2 - Moderna risk series) 08/13/2020   Zoster Vaccines- Shingrix (1 of 2) 01/12/2023 (Originally 09/04/1964)   Pneumonia Vaccine 53+ Years old (1 of 1 - PCV) 10/12/2023 (Originally 09/04/2010)   Hepatitis C Screening  10/12/2023 (Originally 09/05/1963)   INFLUENZA VACCINE  02/23/2023   HEMOGLOBIN A1C  04/14/2023   Diabetic kidney evaluation - Urine ACR  06/25/2023   Diabetic kidney evaluation - eGFR measurement  10/12/2023   FOOT EXAM  10/12/2023   OPHTHALMOLOGY EXAM  11/28/2023   Medicare Annual Wellness (AWV)  12/13/2023   DTaP/Tdap/Td (2 - Td or Tdap) 06/24/2032   DEXA SCAN  Completed   HPV VACCINES  Aged Out    Health Maintenance  Health Maintenance Due  Topic Date Due   COVID-19 Vaccine (2 - Moderna risk series) 08/13/2020    Colorectal cancer screening: No longer required.   Mammogram: No longer required  Lung Cancer Screening: (Low Dose CT Chest recommended if Age 57-80 years, 30 pack-year currently smoking OR have quit w/in 15years.) does not qualify.   Lung Cancer Screening Referral: Does not qualify  Additional Screening:  Hepatitis C Screening: does not qualify;   Vision Screening: Recommended annual ophthalmology exams for early detection of glaucoma and other disorders of the  eye. Is the patient up to date with their annual eye exam?  Yes  Who is the provider or what is the name of the office in which the patient attends annual eye exams?  If pt is not established with a provider, would they like to be referred to a provider to establish care? No .   Dental Screening: Recommended annual dental exams for proper oral hygiene  Community Resource Referral / Chronic Care Management: CRR required this visit?  No   CCM required this visit?  No      Plan:     I have personally reviewed and noted the following  in the patient's chart:   Medical and social history Use of alcohol, tobacco or illicit drugs  Current medications and supplements including opioid prescriptions. Patient is not currently taking opioid prescriptions. Functional ability and status Nutritional status Physical activity Advanced directives List of other physicians Hospitalizations, surgeries, and ER visits in previous 12 months Vitals Screenings to include cognitive, depression, and falls Referrals and appointments  In addition, I have reviewed and discussed with patient certain preventive protocols, quality metrics, and best practice recommendations. A written personalized care plan for preventive services as well as general preventive health recommendations were provided to patient.     Dorene Sorrow, LPN   1/61/[id number]

## 2023-01-18 ENCOUNTER — Encounter: Payer: Self-pay | Admitting: Family Medicine

## 2023-01-18 ENCOUNTER — Ambulatory Visit (INDEPENDENT_AMBULATORY_CARE_PROVIDER_SITE_OTHER): Payer: Medicare HMO | Admitting: Family Medicine

## 2023-01-18 VITALS — BP 130/77 | HR 83 | Temp 96.6°F | Ht 65.0 in | Wt 224.0 lb

## 2023-01-18 DIAGNOSIS — I152 Hypertension secondary to endocrine disorders: Secondary | ICD-10-CM

## 2023-01-18 DIAGNOSIS — E1169 Type 2 diabetes mellitus with other specified complication: Secondary | ICD-10-CM | POA: Diagnosis not present

## 2023-01-18 DIAGNOSIS — F320 Major depressive disorder, single episode, mild: Secondary | ICD-10-CM | POA: Diagnosis not present

## 2023-01-18 DIAGNOSIS — E1159 Type 2 diabetes mellitus with other circulatory complications: Secondary | ICD-10-CM | POA: Diagnosis not present

## 2023-01-18 DIAGNOSIS — E1122 Type 2 diabetes mellitus with diabetic chronic kidney disease: Secondary | ICD-10-CM | POA: Diagnosis not present

## 2023-01-18 DIAGNOSIS — N183 Chronic kidney disease, stage 3 unspecified: Secondary | ICD-10-CM

## 2023-01-18 DIAGNOSIS — E785 Hyperlipidemia, unspecified: Secondary | ICD-10-CM

## 2023-01-18 LAB — BAYER DCA HB A1C WAIVED: HB A1C (BAYER DCA - WAIVED): 6.8 % — ABNORMAL HIGH (ref 4.8–5.6)

## 2023-01-18 MED ORDER — LISINOPRIL 10 MG PO TABS
10.0000 mg | ORAL_TABLET | Freq: Every day | ORAL | 1 refills | Status: DC
Start: 2023-01-18 — End: 2023-07-13

## 2023-01-18 MED ORDER — METOPROLOL SUCCINATE ER 50 MG PO TB24: 50.0000 mg | ORAL_TABLET | Freq: Every day | ORAL | 1 refills | Status: DC

## 2023-01-18 MED ORDER — ATORVASTATIN CALCIUM 80 MG PO TABS
80.0000 mg | ORAL_TABLET | Freq: Every day | ORAL | 1 refills | Status: DC
Start: 2023-01-18 — End: 2023-07-13

## 2023-01-18 MED ORDER — ESCITALOPRAM OXALATE 5 MG PO TABS
5.0000 mg | ORAL_TABLET | Freq: Every day | ORAL | 0 refills | Status: DC
Start: 2023-01-18 — End: 2023-05-18

## 2023-01-18 NOTE — Patient Instructions (Addendum)
If your symptoms worsen or you have thoughts of suicide/homicide, PLEASE SEEK IMMEDIATE MEDICAL ATTENTION.  You may always call the National Suicide Hotline.  This is available 24 hours a day, 7 days a week.  Their number is: 1-800-273-8255  Taking the medicine as directed and not missing any doses is one of the best things you can do to treat your depression.  Here are some things to keep in mind:  Side effects (stomach upset, some increased anxiety) may happen before you notice a benefit.  These side effects typically go away over time. Changes to your dose of medicine or a change in medication all together is sometimes necessary Most people need to be on medication at least 12 months Many people will notice an improvement within two weeks but the full effect of the medication can take up to 4-6 weeks Stopping the medication when you start feeling better often results in a return of symptoms Never discontinue your medication without contacting a health care professional first.  Some medications require gradual discontinuation/ taper and can make you sick if you stop them abruptly.  If your symptoms worsen or you have thoughts of suicide/homicide, PLEASE SEEK IMMEDIATE MEDICAL ATTENTION.  You may always call:  National Suicide Hotline: 800-273-8255 [place] Crisis Line: 336-832-9700 Crisis Recovery in [place]: 800-939-5911  These are available 24 hours a day, 7 days a week. Continue to monitor your blood sugars as we discussed and record them. Bring the log to your next appointment.  Take your medications as directed.    Goal Blood glucose:    Fasting (before meals) = 80 to 130   Within 2 hours of eating = less than 180   Understanding your Hemoglobin A1c:     Diabetes Mellitus and Nutrition    I think that you would greatly benefit from seeing a nutritionist. If this is something you are interested in, please call Dr Sykes at [phone] to schedule an  appointment.   When you have diabetes (diabetes mellitus), it is very important to have healthy eating habits because your blood sugar (glucose) levels are greatly affected by what you eat and drink. Eating healthy foods in the appropriate amounts, at about the same times every day, can help you: Control your blood glucose. Lower your risk of heart disease. Improve your blood pressure. Reach or maintain a healthy weight.  Every person with diabetes is different, and each person has different needs for a meal plan. Your health care provider may recommend that you work with a diet and nutrition specialist (dietitian) to make a meal plan that is best for you. Your meal plan may vary depending on factors such as: The calories you need. The medicines you take. Your weight. Your blood glucose, blood pressure, and cholesterol levels. Your activity level. Other health conditions you have, such as heart or kidney disease.  How do carbohydrates affect me? Carbohydrates affect your blood glucose level more than any other type of food. Eating carbohydrates naturally increases the amount of glucose in your blood. Carbohydrate counting is a method for keeping track of how many carbohydrates you eat. Counting carbohydrates is important to keep your blood glucose at a healthy level, especially if you use insulin or take certain oral diabetes medicines. It is important to know how many carbohydrates you can safely have in each meal. This is different for every person. Your dietitian can help you calculate how many carbohydrates you should have at each meal and for snack. Foods   that contain carbohydrates include: Bread, cereal, rice, pasta, and crackers. Potatoes and corn. Peas, beans, and lentils. Milk and yogurt. Fruit and juice. Desserts, such as cakes, cookies, ice cream, and candy.  How does alcohol affect me? Alcohol can cause a sudden decrease in blood glucose (hypoglycemia), especially if you use  insulin or take certain oral diabetes medicines. Hypoglycemia can be a life-threatening condition. Symptoms of hypoglycemia (sleepiness, dizziness, and confusion) are similar to symptoms of having too much alcohol. If your health care provider says that alcohol is safe for you, follow these guidelines: Limit alcohol intake to no more than 1 drink per day for nonpregnant women and 2 drinks per day for men. One drink equals 12 oz of beer, 5 oz of wine, or 1 oz of hard liquor. Do not drink on an empty stomach. Keep yourself hydrated with water, diet soda, or unsweetened iced tea. Keep in mind that regular soda, juice, and other mixers may contain a lot of sugar and must be counted as carbohydrates.  What are tips for following this plan?  Reading food labels Start by checking the serving size on the label. The amount of calories, carbohydrates, fats, and other nutrients listed on the label are based on one serving of the food. Many foods contain more than one serving per package. Check the total grams (g) of carbohydrates in one serving. You can calculate the number of servings of carbohydrates in one serving by dividing the total carbohydrates by 15. For example, if a food has 30 g of total carbohydrates, it would be equal to 2 servings of carbohydrates. Check the number of grams (g) of saturated and trans fats in one serving. Choose foods that have low or no amount of these fats. Check the number of milligrams (mg) of sodium in one serving. Most people should limit total sodium intake to less than 2,300 mg per day. Always check the nutrition information of foods labeled as "low-fat" or "nonfat". These foods may be higher in added sugar or refined carbohydrates and should be avoided. Talk to your dietitian to identify your daily goals for nutrients listed on the label.  Shopping Avoid buying canned, premade, or processed foods. These foods tend to be high in fat, sodium, and added sugar. Shop  around the outside edge of the grocery store. This includes fresh fruits and vegetables, bulk grains, fresh meats, and fresh dairy.  Cooking Use low-heat cooking methods, such as baking, instead of high-heat cooking methods like deep frying. Cook using healthy oils, such as olive, canola, or sunflower oil. Avoid cooking with butter, cream, or high-fat meats.  Meal planning Eat meals and snacks regularly, preferably at the same times every day. Avoid going long periods of time without eating. Eat foods high in fiber, such as fresh fruits, vegetables, beans, and whole grains. Talk to your dietitian about how many servings of carbohydrates you can eat at each meal. Eat 4-6 ounces of lean protein each day, such as lean meat, chicken, fish, eggs, or tofu. 1 ounce is equal to 1 ounce of meat, chicken, or fish, 1 egg, or 1/4 cup of tofu. Eat some foods each day that contain healthy fats, such as avocado, nuts, seeds, and fish.  Lifestyle  Check your blood glucose regularly. Exercise at least 30 minutes 5 or more days each week, or as told by your health care provider. Take medicines as told by your health care provider. Do not use any products that contain nicotine or tobacco, such as   cigarettes and e-cigarettes. If you need help quitting, ask your health care provider. Work with a counselor or diabetes educator to identify strategies to manage stress and any emotional and social challenges.  What are some questions to ask my health care provider? Do I need to meet with a diabetes educator? Do I need to meet with a dietitian? What number can I call if I have questions? When are the best times to check my blood glucose?  Where to find more information: American Diabetes Association: diabetes.org/food-and-fitness/food Academy of Nutrition and Dietetics: www.eatright.org/resources/health/diseases-and-conditions/diabetes National Institute of Diabetes and Digestive and Kidney Diseases (NIH):  www.niddk.nih.gov/health-information/diabetes/overview/diet-eating-physical-activity  Summary A healthy meal plan will help you control your blood glucose and maintain a healthy lifestyle. Working with a diet and nutrition specialist (dietitian) can help you make a meal plan that is best for you. Keep in mind that carbohydrates and alcohol have immediate effects on your blood glucose levels. It is important to count carbohydrates and to use alcohol carefully. This information is not intended to replace advice given to you by your health care provider. Make sure you discuss any questions you have with your health care provider. Document Released: 04/07/2005 Document Revised: 08/15/2016 Document Reviewed: 08/15/2016 [employer] Interactive Patient Education  2018 [employer]    

## 2023-01-18 NOTE — Progress Notes (Unsigned)
Subjective:  Patient ID: Kristina Peterson, female    DOB: Sep 27, 1945, 77 y.o.   MRN: [id number]  Patient Care Team: Sonny Masters, FNP as PCP - General (Family Medicine) [facility]   Chief Complaint:  Diabetes (3 month follow up )   HPI: Kristina Peterson is a 77 y.o. female presenting on 01/18/2023 for Diabetes (3 month follow up )   Diabetes She presents for her follow-up diabetic visit. She has type 2 diabetes mellitus. No MedicAlert identification noted. The initial diagnosis of diabetes was made 30 years ago. Her disease course has been fluctuating. Pertinent negatives for hypoglycemia include no confusion, dizziness, headaches, hunger, mood changes, nervousness/anxiousness, sleepiness, speech difficulty, sweats or tremors. Associated symptoms include fatigue. Pertinent negatives for diabetes include no blurred vision, no chest pain, no foot paresthesias, no polydipsia, no polyphagia, no polyuria, no visual change, no weakness and no weight loss. Symptoms are improving. There are no diabetic complications. Pertinent negatives for diabetic complications include no heart disease, nephropathy, peripheral neuropathy or retinopathy. Risk factors for coronary artery disease include hypertension, post-menopausal and obesity. She is compliant with treatment most of the time. Her home blood glucose trend is fluctuating minimally. Her breakfast blood glucose range is generally 140-180 mg/dl.   1. Type 2 diabetes mellitus with other specified complication, without long-term current use of insulin (HCC) Pt presents for follow up evaluation of Type 2 diabetes mellitus.  Current symptoms include none. Patient denies foot ulcerations, hyperglycemia, hyperglycemia  , hypoglycemia , hypoglycemia  , increased appetite, nausea, paresthesia of the feet, polydipsia, polyuria, visual disturbances, vomiting, and weight loss. Compliant with meds - No Current monitoring regimen: none and home blood tests -  daily Home blood sugar records:  140-150s Any episodes of hypoglycemia? no Known diabetic complications: none Cardiovascular risk factors: diabetes mellitus HTN HLD Eye exam current (within one year): yes Podiatry yearly?  No Weight trend: stable Current diet: in general, an "unhealthy" diet Current exercise: walking Urine microalbumin UTD? Yes Is She on ACE inhibitor or angiotensin II receptor blocker?  Yes,  Is She on statin? Yes lipitor Is She on ASA 81 mg daily?  Yes   2. Hyperlipidemia associated with type 2 diabetes mellitus (HCC) Poor diet and poor activity   3. Hypertension associated with diabetes (HCC) Takes medications as directed not compliant with 2g sodium diet  4. Pt depressive symptoms. Pt asking for bupropion. Because her daughter takes buprophion. Denies any stressful events since last visit to clinic. States she has a hard time getting motivated and states she doesn't have a lot of energy specifically in morning. Denies SI.     01/18/2023    3:08 PM 12/13/2022   11:15 AM 10/12/2022    3:06 PM 06/24/2022    9:51 AM 04/26/2022   11:28 AM  Depression screen PHQ 2/9  Decreased Interest 2 0 0 1 0  Down, Depressed, Hopeless 1 1 0 0 0  PHQ - 2 Score 3 1 0 1 0  Altered sleeping 0  1 1   Tired, decreased energy 1  1 1    Change in appetite 0  0 1   Feeling bad or failure about yourself  1  0 0   Trouble concentrating 1  0 0   Moving slowly or fidgety/restless 0  0 0   Suicidal thoughts 0  0 0   PHQ-9 Score 6  2 4    Difficult doing work/chores Somewhat difficult  Not difficult  at all Somewhat difficult        Relevant past medical, surgical, family, and social history reviewed and updated as indicated.  Allergies and medications reviewed and updated. Data reviewed: Chart in Epic.   Past Medical History:  Diagnosis Date   Diabetes mellitus without complication (HCC)    Hypertension     Past Surgical History:  Procedure Laterality Date   DILATION AND  CURETTAGE OF UTERUS     HYSTEROSCOPY WITH D & C N/A 02/13/2015   Procedure: HYSTEROSCOPY, UTERINE CURETTAGE;  Surgeon: Lazaro Arms, MD;  Location: [facility];  Service: Gynecology;  Laterality: N/A;    Social History   Socioeconomic History   Marital status: Married    Spouse name: Not on file   Number of children: Not on file   Years of education: Not on file   Highest education level: Not on file  Occupational History   Not on file  Tobacco Use   Smoking status: Never   Smokeless tobacco: Never  Vaping Use   Vaping Use: Never used  Substance and Sexual Activity   Alcohol use: No    Alcohol/week: 0.0 standard drinks of alcohol   Drug use: No   Sexual activity: Not Currently  Other Topics Concern   Not on file  Social History Narrative   Not on file   Social Determinants of Health   Financial Resource Strain: Low Risk  (12/13/2022)   Overall Financial Resource Strain (CARDIA)    Difficulty of Paying Living Expenses: Not hard at all  Food Insecurity: No Food Insecurity (12/13/2022)   Hunger Vital Sign    Worried About Running Out of Food in the Last Year: Never true    Ran Out of Food in the Last Year: Never true  Transportation Needs: No Transportation Needs (12/13/2022)   PRAPARE - Administrator, Civil Service (Medical): No    Lack of Transportation (Non-Medical): No  Physical Activity: Insufficiently Active (12/13/2022)   Exercise Vital Sign    Days of Exercise per Week: 1 day    Minutes of Exercise per Session: 10 min  Stress: No Stress Concern Present (12/13/2022)   [employer] of Occupational Health - Occupational Stress Questionnaire    Feeling of Stress : Only a little  Social Connections: Moderately Integrated (12/13/2022)   Social Connection and Isolation Panel [NHANES]    Frequency of Communication with Friends and Family: More than three times a week    Frequency of Social Gatherings with Friends and Family: More than three times a week     Attends Religious Services: 1 to 4 times per year    Active Member of [employer] or Organizations: No    Attends Banker Meetings: Never    Marital Status: Married  Catering manager Violence: Not At Risk (12/13/2022)   Humiliation, Afraid, Rape, and Kick questionnaire    Fear of Current or Ex-Partner: No    Emotionally Abused: No    Physically Abused: No    Sexually Abused: No    Outpatient Encounter Medications as of 01/18/2023  Medication Sig   aspirin EC 81 MG tablet Take 81 mg by mouth daily.   Cholecalciferol 50 MCG (2000 UT) CAPS Take by mouth.   folic acid (FOLVITE) 1 MG tablet Take by mouth.   ibuprofen (ADVIL) 200 MG tablet Take by mouth.   levocetirizine (XYZAL) 5 MG tablet Take 1 tablet (5 mg total) by mouth every evening.   omega-3 fish oil (MAXEPA)  1000 MG CAPS capsule Take 1 capsule by mouth daily.   omeprazole (PRILOSEC) 20 MG capsule Take 20 mg by mouth daily.   Semaglutide, 1 MG/DOSE, 4 MG/3ML SOPN Inject 1 mg as directed once a week.   [DISCONTINUED] atorvastatin (LIPITOR) 80 MG tablet TAKE 1 TABLET BY MOUTH AT BEDTIME   [DISCONTINUED] lisinopril (ZESTRIL) 10 MG tablet Take 1 tablet by mouth once daily   [DISCONTINUED] metoprolol succinate (TOPROL-XL) 50 MG 24 hr tablet Take 1 tablet (50 mg total) by mouth daily.   atorvastatin (LIPITOR) 80 MG tablet Take 1 tablet (80 mg total) by mouth at bedtime.   lisinopril (ZESTRIL) 10 MG tablet Take 1 tablet (10 mg total) by mouth daily.   metoprolol succinate (TOPROL-XL) 50 MG 24 hr tablet Take 1 tablet (50 mg total) by mouth daily.   No facility-administered encounter medications on file as of 01/18/2023.    No Known Allergies  Review of Systems  Constitutional:  Positive for activity change, appetite change and fatigue. Negative for chills, diaphoresis, fever, unexpected weight change and weight loss.  HENT: Negative.    Eyes: Negative.  Negative for blurred vision, photophobia and visual disturbance.   Respiratory:  Negative for cough and shortness of breath.   Cardiovascular:  Negative for chest pain, palpitations and leg swelling.  Gastrointestinal: Negative.   Endocrine: Negative for polydipsia, polyphagia and polyuria.  Genitourinary:  Negative for decreased urine volume and difficulty urinating.  Musculoskeletal: Negative.   Skin: Negative.   Allergic/Immunologic: Negative.   Neurological:  Negative for dizziness, tremors, speech difficulty, weakness and headaches.  Psychiatric/Behavioral:  Positive for agitation and sleep disturbance. Negative for behavioral problems, confusion, decreased concentration, dysphoric mood, hallucinations, self-injury and suicidal ideas. The patient is not nervous/anxious and is not hyperactive.   All other systems reviewed and are negative.       Objective:  BP 130/77   Pulse 83   Temp (!) 96.6 F (35.9 C) (Temporal)   Ht 5\' 5"  (1.651 m)   Wt 224 lb (101.6 kg)   SpO2 92%   BMI 37.28 kg/m    Wt Readings from Last 3 Encounters:  01/18/23 224 lb (101.6 kg)  10/12/22 224 lb 9.6 oz (101.9 kg)  06/24/22 222 lb 12.8 oz (101.1 kg)    Physical Exam Vitals and nursing note reviewed.  Constitutional:      Appearance: Normal appearance. She is obese.  HENT:     Head: Normocephalic and atraumatic.     Nose: Nose normal.     Mouth/Throat:     Mouth: Mucous membranes are moist.     Pharynx: Oropharynx is clear.  Eyes:     Extraocular Movements: Extraocular movements intact.     Conjunctiva/sclera: Conjunctivae normal.     Pupils: Pupils are equal, round, and reactive to light.  Cardiovascular:     Rate and Rhythm: Normal rate and regular rhythm.     Pulses: Normal pulses.  Pulmonary:     Effort: Pulmonary effort is normal.     Breath sounds: Normal breath sounds.  Abdominal:     Palpations: Abdomen is soft.  Musculoskeletal:        General: Normal range of motion.     Cervical back: Normal range of motion.     Right lower leg: No  edema.     Left lower leg: No edema.  Skin:    General: Skin is warm.     Capillary Refill: Capillary refill takes less than 2 seconds.  Neurological:  General: No focal deficit present.     Mental Status: She is alert and oriented to person, place, and time.  Psychiatric:        Behavior: Behavior normal.        Thought Content: Thought content normal.        Judgment: Judgment normal.     Comments: Flat affect and expresses lack of motivation     Results for orders placed or performed in visit on 10/12/22  CMP14+EGFR  Result Value Ref Range   Glucose 148 (H) 70 - 99 mg/dL   BUN 17 8 - 27 mg/dL   Creatinine, Ser 5.95 (H) 0.57 - 1.00 mg/dL   eGFR 41 (L) >63 OV/FIE/3.32   BUN/Creatinine Ratio 13 12 - 28   Sodium 136 134 - 144 mmol/L   Potassium 4.7 3.5 - 5.2 mmol/L   Chloride 99 96 - 106 mmol/L   CO2 21 20 - 29 mmol/L   Calcium 9.9 8.7 - 10.3 mg/dL   Total Protein 6.8 6.0 - 8.5 g/dL   Albumin 4.2 3.8 - 4.8 g/dL   Globulin, Total 2.6 1.5 - 4.5 g/dL   Albumin/Globulin Ratio 1.6 1.2 - 2.2   Bilirubin Total 0.5 0.0 - 1.2 mg/dL   Alkaline Phosphatase 100 44 - 121 IU/L   AST 18 0 - 40 IU/L   ALT 19 0 - 32 IU/L  Bayer DCA Hb A1c Waived  Result Value Ref Range   HB A1C (BAYER DCA - WAIVED) 7.0 (H) 4.8 - 5.6 %  Thyroid Panel With TSH  Result Value Ref Range   TSH 3.260 0.450 - 4.500 uIU/mL   T4, Total 8.3 4.5 - 12.0 ug/dL   T3 Uptake Ratio 29 24 - 39 %   Free Thyroxine Index 2.4 1.2 - 4.9  Lipid panel  Result Value Ref Range   Cholesterol, Total 128 100 - 199 mg/dL   Triglycerides 951 (H) 0 - 149 mg/dL   HDL 49 >88 mg/dL   VLDL Cholesterol Cal 27 5 - 40 mg/dL   LDL Chol Calc (NIH) 52 0 - 99 mg/dL   Chol/HDL Ratio 2.6 0.0 - 4.4 ratio       Pertinent labs & imaging results that were available during my care of the patient were reviewed by me and considered in my medical decision making.  Assessment & Plan:  Kenzleigh was seen today for diabetes.  Diagnoses and all  orders for this visit:  Type 2 diabetes mellitus with other specified complication, without long-term current use of insulin (HCC) A1C 6.8, no changes to regimen. Consider Marcelline Deist if renal function continues to decline.  -     Bayer DCA Hb A1c Waived  Hyperlipidemia associated with type 2 diabetes mellitus (HCC) Diet encouraged - increase intake of fresh fruits and vegetables, increase intake of lean proteins. Bake, broil, or grill foods. Avoid fried, greasy, and fatty foods. Avoid fast foods. Increase intake of fiber-rich whole grains. Exercise encouraged - at least 150 minutes per week and advance as tolerated.  Goal BMI < 25. Continue medications as prescribed. Follow up in 3-6 months as discussed.  -     Lipid panel -     CMP14+EGFR -     atorvastatin (LIPITOR) 80 MG tablet; Take 1 tablet (80 mg total) by mouth at bedtime.  Hypertension associated with diabetes (HCC) BP well controlled. Changes were not made in regimen today. Goal BP is 130/80. Pt aware to report any persistent high or low readings.  DASH diet and exercise encouraged. Exercise at least 150 minutes per week and increase as tolerated. Goal BMI > 25. Stress management encouraged. Avoid nicotine and tobacco product use. Avoid excessive alcohol and NSAID's. Avoid more than 2000 mg of sodium daily. Medications as prescribed. Follow up as scheduled.  -     lisinopril (ZESTRIL) 10 MG tablet; Take 1 tablet (10 mg total) by mouth daily. -     metoprolol succinate (TOPROL-XL) 50 MG 24 hr tablet; Take 1 tablet (50 mg total) by mouth daily.  Depression, major, single episode, mild (HCC) Elevated PHQ9 score of 6 flat affect and verbalization of lack of motivation.   Continue all other maintenance medications.  Follow up plan: Return in about 3 months (around 04/20/2023) for DM.  Retern to clinic in 6 weeks after starting Lexapro. Evaluate at that time for start of Farixga.   Continue healthy lifestyle choices, including diet (rich in  fruits, vegetables, and lean proteins, and low in salt and simple carbohydrates) and exercise (at least 30 minutes of moderate physical activity daily).  Educational handout given for DASH Diet  The above assessment and management plan was discussed with the patient. The patient verbalized understanding of and has agreed to the management plan. Patient is aware to call the clinic if they develop any new symptoms or if symptoms persist or worsen. Patient is aware when to return to the clinic for a follow-up visit. Patient educated on when it is appropriate to go to the emergency department.   Maryelizabeth Kaufmann NP student [facility] [phone]   I personally was present during the history, physical exam, and medical decision-making activities of this visit and have verified that the services and findings are accurately documented in the nurse practitioner student's note.  Kari Baars, FNP-C [facility] [address] [place], [place] [address] [phone]

## 2023-01-19 LAB — CMP14+EGFR
ALT: 22 IU/L (ref 0–32)
AST: 19 IU/L (ref 0–40)
Albumin: 4.5 g/dL (ref 3.8–4.8)
Alkaline Phosphatase: 105 IU/L (ref 44–121)
BUN/Creatinine Ratio: 14 (ref 12–28)
BUN: 18 mg/dL (ref 8–27)
Bilirubin Total: 0.6 mg/dL (ref 0.0–1.2)
CO2: 20 mmol/L (ref 20–29)
Calcium: 10 mg/dL (ref 8.7–10.3)
Chloride: 100 mmol/L (ref 96–106)
Creatinine, Ser: 1.26 mg/dL — ABNORMAL HIGH (ref 0.57–1.00)
Globulin, Total: 2.6 g/dL (ref 1.5–4.5)
Glucose: 146 mg/dL — ABNORMAL HIGH (ref 70–99)
Potassium: 4.6 mmol/L (ref 3.5–5.2)
Sodium: 137 mmol/L (ref 134–144)
Total Protein: 7.1 g/dL (ref 6.0–8.5)
eGFR: 44 mL/min/{1.73_m2} — ABNORMAL LOW (ref >59)

## 2023-01-19 LAB — LIPID PANEL
Chol/HDL Ratio: 2.5 ratio (ref 0.0–4.4)
Cholesterol, Total: 130 mg/dL (ref 100–199)
HDL: 53 mg/dL (ref >39)
LDL Chol Calc (NIH): 50 mg/dL (ref 0–99)
Triglycerides: 159 mg/dL — ABNORMAL HIGH (ref 0–149)
VLDL Cholesterol Cal: 27 mg/dL (ref 5–40)

## 2023-03-28 ENCOUNTER — Other Ambulatory Visit: Payer: Self-pay | Admitting: Family Medicine

## 2023-03-28 DIAGNOSIS — E1169 Type 2 diabetes mellitus with other specified complication: Secondary | ICD-10-CM

## 2023-03-28 NOTE — Telephone Encounter (Signed)
Kristina Peterson. NTBS for 3 mos FU at end of Sept. RF sent to pharmacy

## 2023-03-28 NOTE — Telephone Encounter (Signed)
Pt calling to check on refill status. Says she is completely out and needs refills sent in today.

## 2023-03-29 NOTE — Telephone Encounter (Signed)
Pt made appt on sept 12

## 2023-04-06 ENCOUNTER — Ambulatory Visit (INDEPENDENT_AMBULATORY_CARE_PROVIDER_SITE_OTHER): Payer: Medicare HMO

## 2023-04-06 ENCOUNTER — Ambulatory Visit (INDEPENDENT_AMBULATORY_CARE_PROVIDER_SITE_OTHER): Payer: Medicare HMO | Admitting: Family Medicine

## 2023-04-06 ENCOUNTER — Encounter: Payer: Self-pay | Admitting: Family Medicine

## 2023-04-06 VITALS — BP 144/78 | HR 68 | Temp 96.5°F | Ht 65.0 in | Wt 220.6 lb

## 2023-04-06 DIAGNOSIS — M25562 Pain in left knee: Secondary | ICD-10-CM | POA: Diagnosis not present

## 2023-04-06 DIAGNOSIS — I152 Hypertension secondary to endocrine disorders: Secondary | ICD-10-CM

## 2023-04-06 DIAGNOSIS — F320 Major depressive disorder, single episode, mild: Secondary | ICD-10-CM | POA: Diagnosis not present

## 2023-04-06 DIAGNOSIS — G8929 Other chronic pain: Secondary | ICD-10-CM | POA: Diagnosis not present

## 2023-04-06 DIAGNOSIS — E1169 Type 2 diabetes mellitus with other specified complication: Secondary | ICD-10-CM | POA: Diagnosis not present

## 2023-04-06 DIAGNOSIS — N183 Chronic kidney disease, stage 3 unspecified: Secondary | ICD-10-CM

## 2023-04-06 DIAGNOSIS — Z7985 Long-term (current) use of injectable non-insulin antidiabetic drugs: Secondary | ICD-10-CM

## 2023-04-06 DIAGNOSIS — E1159 Type 2 diabetes mellitus with other circulatory complications: Secondary | ICD-10-CM

## 2023-04-06 DIAGNOSIS — E785 Hyperlipidemia, unspecified: Secondary | ICD-10-CM

## 2023-04-06 DIAGNOSIS — E1122 Type 2 diabetes mellitus with diabetic chronic kidney disease: Secondary | ICD-10-CM

## 2023-04-06 LAB — BAYER DCA HB A1C WAIVED: HB A1C (BAYER DCA - WAIVED): 6.9 % — ABNORMAL HIGH (ref 4.8–5.6)

## 2023-04-06 MED ORDER — OZEMPIC (1 MG/DOSE) 4 MG/3ML ~~LOC~~ SOPN
1.0000 mg | PEN_INJECTOR | SUBCUTANEOUS | 0 refills | Status: DC
Start: 2023-04-06 — End: 2023-05-17

## 2023-04-06 NOTE — Patient Instructions (Signed)

## 2023-04-06 NOTE — Progress Notes (Signed)
Subjective:  Patient ID: Kristina Peterson, female    DOB: 12/18/1945, 77 y.o.   MRN: [id number]  Patient Care Team: Sonny Masters, FNP as PCP - General (Family Medicine) [facility]   Chief Complaint:  Depression (6 week follow up - patient states that she never started it. ) and Diabetes   HPI: Kristina Peterson is a 77 y.o. female presenting on 04/06/2023 for Depression (6 week follow up - patient states that she never started it. ) and Diabetes   1. Type 2 diabetes mellitus with other specified complication, without long-term current use of insulin (HCC) She has been taking her Ozempic and tolerating well. She denies polyuria, polyphagia, or polydipsia.   2. Hypertension associated with diabetes (HCC) Complaint with meds - Yes Current Medications - lisinopril, toprol xl Checking BP at home - no Exercising Regularly - No Watching Salt intake - Yes Pertinent ROS:  Headache - No Fatigue - No Visual Disturbances - No Chest pain - No Dyspnea - No Palpitations - No LE edema - No They report good compliance with medications and can restate their regimen by memory. No medication side effects.  BP Readings from Last 3 Encounters:  04/06/23 (!) 144/78  01/18/23 130/77  10/12/22 133/78     3. Hyperlipidemia associated with type 2 diabetes mellitus (HCC) Compliant with medications - Yes Current medications - atorvastatin  Side effects from medications - No Diet - generally healthy Exercise - none   4. CKD stage 3 due to type 2 diabetes mellitus (HCC) Denies any changes in urine output. Avoids NSAIDs. No confusion, edema, weakness, or fatigue. Is on ACEi.  5. Depression, major, single episode, mild (HCC) Never started her medications. Discussed this in detail today. Feels she does need to trial the medications as she has decreased interest and fatigue.     04/06/2023    2:28 PM 01/18/2023    3:08 PM 12/13/2022   11:15 AM 10/12/2022    3:06 PM 06/24/2022    9:51  AM  Depression screen PHQ 2/9  Decreased Interest 1 2 0 0 1  Down, Depressed, Hopeless 0 1 1 0 0  PHQ - 2 Score 1 3 1  0 1  Altered sleeping 1 0  1 1  Tired, decreased energy 1 1  1 1   Change in appetite 0 0  0 1  Feeling bad or failure about yourself  0 1  0 0  Trouble concentrating 0 1  0 0  Moving slowly or fidgety/restless 0 0  0 0  Suicidal thoughts 0 0  0 0  PHQ-9 Score 3 6  2 4   Difficult doing work/chores Not difficult at all Somewhat difficult  Not difficult at all Somewhat difficult      04/06/2023    2:28 PM 01/18/2023    3:09 PM 10/12/2022    3:06 PM 06/24/2022    9:52 AM  GAD 7 : Generalized Anxiety Score  Nervous, Anxious, on Edge 1 0 0 0  Control/stop worrying 0 0 0 0  Worry too much - different things 0 1 0 0  Trouble relaxing 0 0 0 0  Restless 0 0 0 0  Easily annoyed or irritable 0 1 0 1  Afraid - awful might happen 0 0 0 0  Total GAD 7 Score 1 2 0 1  Anxiety Difficulty Not difficult at all Not difficult at all Not difficult at all Not difficult at all  6. Chronic pain of left knee Knee Pain: Patient presents with knee pain involving the  left knee. Onset of the symptoms was several months ago. Inciting event: none known. Current symptoms include giving out, pain located lateral side of knee, and stiffness. Pain is aggravated by going up and down stairs, lateral movements, and rising after sitting.  Patient has had no prior knee problems. Evaluation to date: none. Treatment to date: none.      Relevant past medical, surgical, family, and social history reviewed and updated as indicated.  Allergies and medications reviewed and updated. Data reviewed: Chart in Epic.   Past Medical History:  Diagnosis Date   Diabetes mellitus without complication (HCC)    Hypertension     Past Surgical History:  Procedure Laterality Date   DILATION AND CURETTAGE OF UTERUS     HYSTEROSCOPY WITH D & C N/A 02/13/2015   Procedure: HYSTEROSCOPY, UTERINE CURETTAGE;   Surgeon: Lazaro Arms, MD;  Location: [facility];  Service: Gynecology;  Laterality: N/A;    Social History   Socioeconomic History   Marital status: Married    Spouse name: Not on file   Number of children: Not on file   Years of education: Not on file   Highest education level: Not on file  Occupational History   Not on file  Tobacco Use   Smoking status: Never   Smokeless tobacco: Never  Vaping Use   Vaping status: Never Used  Substance and Sexual Activity   Alcohol use: No    Alcohol/week: 0.0 standard drinks of alcohol   Drug use: No   Sexual activity: Not Currently  Other Topics Concern   Not on file  Social History Narrative   Not on file   Social Determinants of Health   Financial Resource Strain: Low Risk  (12/13/2022)   Overall Financial Resource Strain (CARDIA)    Difficulty of Paying Living Expenses: Not hard at all  Food Insecurity: No Food Insecurity (12/13/2022)   Hunger Vital Sign    Worried About Running Out of Food in the Last Year: Never true    Ran Out of Food in the Last Year: Never true  Transportation Needs: No Transportation Needs (12/13/2022)   PRAPARE - Administrator, Civil Service (Medical): No    Lack of Transportation (Non-Medical): No  Physical Activity: Insufficiently Active (12/13/2022)   Exercise Vital Sign    Days of Exercise per Week: 1 day    Minutes of Exercise per Session: 10 min  Stress: No Stress Concern Present (12/13/2022)   [employer] of Occupational Health - Occupational Stress Questionnaire    Feeling of Stress : Only a little  Social Connections: Moderately Integrated (12/13/2022)   Social Connection and Isolation Panel [NHANES]    Frequency of Communication with Friends and Family: More than three times a week    Frequency of Social Gatherings with Friends and Family: More than three times a week    Attends Religious Services: 1 to 4 times per year    Active Member of [employer] or Organizations: No    Attends  Banker Meetings: Never    Marital Status: Married  Catering manager Violence: Not At Risk (12/13/2022)   Humiliation, Afraid, Rape, and Kick questionnaire    Fear of Current or Ex-Partner: No    Emotionally Abused: No    Physically Abused: No    Sexually Abused: No    Outpatient Encounter Medications as of 04/06/2023  Medication Sig  aspirin EC 81 MG tablet Take 81 mg by mouth daily.   atorvastatin (LIPITOR) 80 MG tablet Take 1 tablet (80 mg total) by mouth at bedtime.   Cholecalciferol 50 MCG (2000 UT) CAPS Take by mouth.   folic acid (FOLVITE) 1 MG tablet Take by mouth.   ibuprofen (ADVIL) 200 MG tablet Take by mouth.   levocetirizine (XYZAL) 5 MG tablet Take 1 tablet (5 mg total) by mouth every evening.   lisinopril (ZESTRIL) 10 MG tablet Take 1 tablet (10 mg total) by mouth daily.   metoprolol succinate (TOPROL-XL) 50 MG 24 hr tablet Take 1 tablet (50 mg total) by mouth daily.   omega-3 fish oil (MAXEPA) 1000 MG CAPS capsule Take 1 capsule by mouth daily.   omeprazole (PRILOSEC) 20 MG capsule Take 20 mg by mouth daily.   [DISCONTINUED] OZEMPIC, 1 MG/DOSE, 4 MG/3ML SOPN INJECT 1 MG SUBCUTANEOUSLY ONCE A WEEK AS DIRECTED   escitalopram (LEXAPRO) 5 MG tablet Take 1 tablet (5 mg total) by mouth daily. (Patient not taking: Reported on 04/06/2023)   Semaglutide, 1 MG/DOSE, (OZEMPIC, 1 MG/DOSE,) 4 MG/3ML SOPN Inject 1 mg into the skin once a week.   No facility-administered encounter medications on file as of 04/06/2023.    No Known Allergies  Review of Systems  Constitutional:  Positive for activity change and fatigue. Negative for appetite change, chills, diaphoresis, fever and unexpected weight change.  HENT: Negative.    Eyes: Negative.  Negative for photophobia and visual disturbance.  Respiratory:  Negative for cough, chest tightness and shortness of breath.   Cardiovascular:  Negative for chest pain, palpitations and leg swelling.  Gastrointestinal:  Negative for  abdominal distention, abdominal pain, anal bleeding, blood in stool, constipation, diarrhea, nausea, rectal pain and vomiting.  Endocrine: Negative.  Negative for polydipsia, polyphagia and polyuria.  Genitourinary:  Negative for decreased urine volume, difficulty urinating, dysuria, frequency and urgency.  Musculoskeletal:  Negative for arthralgias and myalgias.  Skin: Negative.   Allergic/Immunologic: Negative.   Neurological:  Negative for dizziness, tremors, seizures, syncope, facial asymmetry, speech difficulty, weakness, light-headedness, numbness and headaches.  Hematological: Negative.   Psychiatric/Behavioral:  Negative for agitation, behavioral problems, confusion, decreased concentration, dysphoric mood, hallucinations, self-injury, sleep disturbance and suicidal ideas. The patient is nervous/anxious. The patient is not hyperactive.   All other systems reviewed and are negative.       Objective:  BP (!) 144/78   Pulse 68   Temp (!) 96.5 F (35.8 C) (Temporal)   Ht 5\' 5"  (1.651 m)   Wt 220 lb 9.6 oz (100.1 kg)   SpO2 94%   BMI 36.71 kg/m    Wt Readings from Last 3 Encounters:  04/06/23 220 lb 9.6 oz (100.1 kg)  01/18/23 224 lb (101.6 kg)  10/12/22 224 lb 9.6 oz (101.9 kg)    Physical Exam Vitals and nursing note reviewed.  Constitutional:      General: She is not in acute distress.    Appearance: Normal appearance. She is well-developed and well-groomed. She is obese. She is not ill-appearing, toxic-appearing or diaphoretic.  HENT:     Head: Normocephalic and atraumatic.     Jaw: There is normal jaw occlusion.     Right Ear: Hearing normal.     Left Ear: Hearing normal.     Nose: Nose normal.     Mouth/Throat:     Lips: Pink.     Mouth: Mucous membranes are moist.     Pharynx: Oropharynx is clear. Uvula  midline.  Eyes:     General: Lids are normal.     Extraocular Movements: Extraocular movements intact.     Conjunctiva/sclera: Conjunctivae normal.      Pupils: Pupils are equal, round, and reactive to light.  Neck:     Trachea: Trachea and phonation normal.  Cardiovascular:     Rate and Rhythm: Normal rate and regular rhythm.     Chest Wall: PMI is not displaced.     Pulses: Normal pulses.     Heart sounds: Normal heart sounds. No murmur heard.    No friction rub. No gallop.  Pulmonary:     Effort: Pulmonary effort is normal. No respiratory distress.     Breath sounds: Normal breath sounds. No wheezing.  Abdominal:     General: Bowel sounds are normal.     Palpations: Abdomen is soft.  Musculoskeletal:        General: Normal range of motion.     Cervical back: Normal range of motion and neck supple.     Right lower leg: No edema.     Left lower leg: No edema.  Skin:    General: Skin is warm and dry.     Capillary Refill: Capillary refill takes less than 2 seconds.     Coloration: Skin is not cyanotic, jaundiced or pale.     Findings: No rash.  Neurological:     General: No focal deficit present.     Mental Status: She is alert and oriented to person, place, and time.     Sensory: Sensation is intact.     Motor: Motor function is intact.     Coordination: Coordination is intact.     Gait: Gait is intact.     Deep Tendon Reflexes: Reflexes are normal and symmetric.  Psychiatric:        Attention and Perception: Attention and perception normal.        Mood and Affect: Mood and affect normal.        Speech: Speech normal.        Behavior: Behavior normal. Behavior is cooperative.        Thought Content: Thought content normal.        Cognition and Memory: Cognition and memory normal.        Judgment: Judgment normal.     Results for orders placed or performed in visit on 01/18/23  Lipid panel  Result Value Ref Range   Cholesterol, Total 130 100 - 199 mg/dL   Triglycerides 829 (H) 0 - 149 mg/dL   HDL 53 >56 mg/dL   VLDL Cholesterol Cal 27 5 - 40 mg/dL   LDL Chol Calc (NIH) 50 0 - 99 mg/dL   Chol/HDL Ratio 2.5 0.0 - 4.4  ratio  CMP14+EGFR  Result Value Ref Range   Glucose 146 (H) 70 - 99 mg/dL   BUN 18 8 - 27 mg/dL   Creatinine, Ser 2.13 (H) 0.57 - 1.00 mg/dL   eGFR 44 (L) >08 MV/HQI/6.96   BUN/Creatinine Ratio 14 12 - 28   Sodium 137 134 - 144 mmol/L   Potassium 4.6 3.5 - 5.2 mmol/L   Chloride 100 96 - 106 mmol/L   CO2 20 20 - 29 mmol/L   Calcium 10.0 8.7 - 10.3 mg/dL   Total Protein 7.1 6.0 - 8.5 g/dL   Albumin 4.5 3.8 - 4.8 g/dL   Globulin, Total 2.6 1.5 - 4.5 g/dL   Bilirubin Total 0.6 0.0 - 1.2 mg/dL   Alkaline  Phosphatase 105 44 - 121 IU/L   AST 19 0 - 40 IU/L   ALT 22 0 - 32 IU/L  Bayer DCA Hb A1c Waived  Result Value Ref Range   HB A1C (BAYER DCA - WAIVED) 6.8 (H) 4.8 - 5.6 %     X-Ray: left knee: minimal degenerative changes. No acute findings. Preliminary x-ray reading by Kari Baars, FNP-C, [facility].   Pertinent labs & imaging results that were available during my care of the patient were reviewed by me and considered in my medical decision making.  Assessment & Plan:  Kristina "Scarlette Calico" was seen today for depression and diabetes.  Diagnoses and all orders for this visit:  Type 2 diabetes mellitus with other specified complication, without long-term current use of insulin (HCC) Labs pending, will adjust regimen if warranted. Continue with diet.  -     Semaglutide, 1 MG/DOSE, (OZEMPIC, 1 MG/DOSE,) 4 MG/3ML SOPN; Inject 1 mg into the skin once a week. -     CBC with Differential/Platelet -     CMP14+EGFR -     Thyroid Panel With TSH -     Bayer DCA Hb A1c Waived  Hypertension associated with diabetes (HCC) BP well controlled. Changes were not made in regimen today. Goal BP is 130/80. Pt aware to report any persistent high or low readings. DASH diet and exercise encouraged. Exercise at least 150 minutes per week and increase as tolerated. Goal BMI > 25. Stress management encouraged. Avoid nicotine and tobacco product use. Avoid excessive alcohol and NSAID's. Avoid more than 2000 mg of  sodium daily. Medications as prescribed. Follow up as scheduled.  -     CBC with Differential/Platelet -     CMP14+EGFR -     Thyroid Panel With TSH  Hyperlipidemia associated with type 2 diabetes mellitus (HCC) Diet encouraged - increase intake of fresh fruits and vegetables, increase intake of lean proteins. Bake, broil, or grill foods. Avoid fried, greasy, and fatty foods. Avoid fast foods. Increase intake of fiber-rich whole grains. Exercise encouraged - at least 150 minutes per week and advance as tolerated.  Goal BMI < 25. Continue medications as prescribed. Follow up in 3-6 months as discussed.  -     CMP14+EGFR  CKD stage 3 due to type 2 diabetes mellitus (HCC) Will repeat labs today and consider Marcelline Deist if warranted.  -     CBC with Differential/Platelet -     CMP14+EGFR  Depression, major, single episode, mild (HCC) Agrees to trial lexapro that she has at home, will start at 2.5 mg daily to see if beneficial.  -     Thyroid Panel With TSH  Chronic pain of left knee Likely arthritis. Will obtain imaging today. Symptomatic care discussed in detail. Can use topicals along with RICE therapy. Report new, worsening, or persistent symptoms.  -     DG Knee 1-2 Views Left     Continue all other maintenance medications.  Follow up plan: Return in about 6 weeks (around 05/18/2023) for depression, 3-4 months for DM.   Continue healthy lifestyle choices, including diet (rich in fruits, vegetables, and lean proteins, and low in salt and simple carbohydrates) and exercise (at least 30 minutes of moderate physical activity daily).  Educational handout given for DM  The above assessment and management plan was discussed with the patient. The patient verbalized understanding of and has agreed to the management plan. Patient is aware to call the clinic if they develop any new symptoms or if  symptoms persist or worsen. Patient is aware when to return to the clinic for a follow-up visit.  Patient educated on when it is appropriate to go to the emergency department.   Kari Baars, FNP-C [facility] [phone]

## 2023-04-07 LAB — CMP14+EGFR
ALT: 20 IU/L (ref 0–32)
AST: 21 IU/L (ref 0–40)
Albumin: 4.6 g/dL (ref 3.8–4.8)
Alkaline Phosphatase: 94 IU/L (ref 44–121)
BUN/Creatinine Ratio: 9 — ABNORMAL LOW (ref 12–28)
BUN: 10 mg/dL (ref 8–27)
Bilirubin Total: 0.7 mg/dL (ref 0.0–1.2)
CO2: 21 mmol/L (ref 20–29)
Calcium: 10 mg/dL (ref 8.7–10.3)
Chloride: 98 mmol/L (ref 96–106)
Creatinine, Ser: 1.11 mg/dL — ABNORMAL HIGH (ref 0.57–1.00)
Globulin, Total: 2.7 g/dL (ref 1.5–4.5)
Glucose: 128 mg/dL — ABNORMAL HIGH (ref 70–99)
Potassium: 4.7 mmol/L (ref 3.5–5.2)
Sodium: 135 mmol/L (ref 134–144)
Total Protein: 7.3 g/dL (ref 6.0–8.5)
eGFR: 51 mL/min/{1.73_m2} — ABNORMAL LOW (ref >59)

## 2023-04-07 LAB — THYROID PANEL WITH TSH
Free Thyroxine Index: 2.4 (ref 1.2–4.9)
T3 Uptake Ratio: 26 % (ref 24–39)
T4, Total: 9.2 ug/dL (ref 4.5–12.0)
TSH: 3.01 u[IU]/mL (ref 0.450–4.500)

## 2023-04-07 LAB — CBC WITH DIFFERENTIAL/PLATELET
Basophils Absolute: 0 10*3/uL (ref 0.0–0.2)
Basos: 1 %
EOS (ABSOLUTE): 0.1 10*3/uL (ref 0.0–0.4)
Eos: 1 %
Hematocrit: 39.8 % (ref 34.0–46.6)
Hemoglobin: 13 g/dL (ref 11.1–15.9)
Immature Grans (Abs): 0 10*3/uL (ref 0.0–0.1)
Immature Granulocytes: 0 %
Lymphocytes Absolute: 2.2 10*3/uL (ref 0.7–3.1)
Lymphs: 38 %
MCH: 30.4 pg (ref 26.6–33.0)
MCHC: 32.7 g/dL (ref 31.5–35.7)
MCV: 93 fL (ref 79–97)
Monocytes Absolute: 0.3 10*3/uL (ref 0.1–0.9)
Monocytes: 5 %
Neutrophils Absolute: 3.2 10*3/uL (ref 1.4–7.0)
Neutrophils: 55 %
Platelets: 212 10*3/uL (ref 150–450)
RBC: 4.28 x10E6/uL (ref 3.77–5.28)
RDW: 13 % (ref 11.7–15.4)
WBC: 5.8 10*3/uL (ref 3.4–10.8)

## 2023-05-17 ENCOUNTER — Other Ambulatory Visit: Payer: Self-pay | Admitting: Family Medicine

## 2023-05-17 DIAGNOSIS — E1169 Type 2 diabetes mellitus with other specified complication: Secondary | ICD-10-CM

## 2023-05-18 ENCOUNTER — Ambulatory Visit: Payer: Medicare HMO | Admitting: Family Medicine

## 2023-05-18 ENCOUNTER — Encounter: Payer: Self-pay | Admitting: Family Medicine

## 2023-05-18 VITALS — BP 125/70 | HR 68 | Temp 97.2°F | Ht 65.0 in | Wt 218.2 lb

## 2023-05-18 DIAGNOSIS — F320 Major depressive disorder, single episode, mild: Secondary | ICD-10-CM | POA: Diagnosis not present

## 2023-05-18 DIAGNOSIS — K429 Umbilical hernia without obstruction or gangrene: Secondary | ICD-10-CM | POA: Diagnosis not present

## 2023-05-18 DIAGNOSIS — E1122 Type 2 diabetes mellitus with diabetic chronic kidney disease: Secondary | ICD-10-CM | POA: Diagnosis not present

## 2023-05-18 DIAGNOSIS — N184 Chronic kidney disease, stage 4 (severe): Secondary | ICD-10-CM | POA: Diagnosis not present

## 2023-05-18 DIAGNOSIS — Z7985 Long-term (current) use of injectable non-insulin antidiabetic drugs: Secondary | ICD-10-CM

## 2023-05-18 MED ORDER — ESCITALOPRAM OXALATE 10 MG PO TABS
10.0000 mg | ORAL_TABLET | Freq: Every day | ORAL | 5 refills | Status: DC
Start: 2023-05-18 — End: 2024-01-23

## 2023-05-18 NOTE — Progress Notes (Signed)
Subjective:  Patient ID: Kristina Peterson, female    DOB: 12-Nov-1945, 77 y.o.   MRN: [id number]  Patient Care Team: Sonny Masters, FNP as PCP - General (Family Medicine) [facility]   Chief Complaint:  Depression (6 week follow up- patient states she is doing better. )   HPI: Kristina Peterson is a 77 y.o. female presenting on 05/18/2023 for Depression (6 week follow up- patient states she is doing better. )   Discussed the use of AI scribe software for clinical note transcription with the patient, who gave verbal consent to proceed.  History of Present Illness   The patient, with a history of diabetes and depression, has been on Lexapro for approximately six weeks. She reports a slight improvement in her depressive symptoms since starting the medication, with no noted side effects such as headaches, abdominal pain, or sleep disturbances. The patient is also on a 5mg  dose of Lexapro.  In addition to her mental health, the patient has been experiencing issues with a hernia, which has been present since her hysterectomy. The hernia occasionally protrudes more than usual and has caused discomfort on two recent occasions during physical activity. The patient reports that the pain subsides after resting.  The patient's diabetes management has been complicated by the cost of her Ozempic medication, which has recently increased significantly. She is currently on a 1mg  dose of Ozempic.         05/18/2023   10:36 AM 04/06/2023    2:28 PM 01/18/2023    3:08 PM 12/13/2022   11:15 AM 10/12/2022    3:06 PM  Depression screen PHQ 2/9  Decreased Interest 1 1 2  0 0  Down, Depressed, Hopeless 0 0 1 1 0  PHQ - 2 Score 1 1 3 1  0  Altered sleeping 1 1 0  1  Tired, decreased energy 1 1 1  1   Change in appetite 1 0 0  0  Feeling bad or failure about yourself  0 0 1  0  Trouble concentrating 0 0 1  0  Moving slowly or fidgety/restless 0 0 0  0  Suicidal thoughts 0 0 0  0  PHQ-9 Score 4 3 6   2   Difficult doing work/chores Not difficult at all Not difficult at all Somewhat difficult  Not difficult at all      05/18/2023   10:36 AM 04/06/2023    2:28 PM 01/18/2023    3:09 PM 10/12/2022    3:06 PM  GAD 7 : Generalized Anxiety Score  Nervous, Anxious, on Edge 0 1 0 0  Control/stop worrying 0 0 0 0  Worry too much - different things 0 0 1 0  Trouble relaxing 0 0 0 0  Restless 0 0 0 0  Easily annoyed or irritable 0 0 1 0  Afraid - awful might happen 0 0 0 0  Total GAD 7 Score 0 1 2 0  Anxiety Difficulty Not difficult at all Not difficult at all Not difficult at all Not difficult at all        Relevant past medical, surgical, family, and social history reviewed and updated as indicated.  Allergies and medications reviewed and updated. Data reviewed: Chart in Epic.   Past Medical History:  Diagnosis Date   Diabetes mellitus without complication (HCC)    Hypertension     Past Surgical History:  Procedure Laterality Date   DILATION AND CURETTAGE OF UTERUS  HYSTEROSCOPY WITH D & C N/A 02/13/2015   Procedure: HYSTEROSCOPY, UTERINE CURETTAGE;  Surgeon: Lazaro Arms, MD;  Location: [facility];  Service: Gynecology;  Laterality: N/A;    Social History   Socioeconomic History   Marital status: Married    Spouse name: Not on file   Number of children: Not on file   Years of education: Not on file   Highest education level: Not on file  Occupational History   Not on file  Tobacco Use   Smoking status: Never   Smokeless tobacco: Never  Vaping Use   Vaping status: Never Used  Substance and Sexual Activity   Alcohol use: No    Alcohol/week: 0.0 standard drinks of alcohol   Drug use: No   Sexual activity: Not Currently  Other Topics Concern   Not on file  Social History Narrative   Not on file   Social Determinants of Health   Financial Resource Strain: Low Risk  (12/13/2022)   Overall Financial Resource Strain (CARDIA)    Difficulty of Paying Living Expenses:  Not hard at all  Food Insecurity: No Food Insecurity (12/13/2022)   Hunger Vital Sign    Worried About Running Out of Food in the Last Year: Never true    Ran Out of Food in the Last Year: Never true  Transportation Needs: No Transportation Needs (12/13/2022)   PRAPARE - Administrator, Civil Service (Medical): No    Lack of Transportation (Non-Medical): No  Physical Activity: Insufficiently Active (12/13/2022)   Exercise Vital Sign    Days of Exercise per Week: 1 day    Minutes of Exercise per Session: 10 min  Stress: No Stress Concern Present (12/13/2022)   [employer] of Occupational Health - Occupational Stress Questionnaire    Feeling of Stress : Only a little  Social Connections: Moderately Integrated (12/13/2022)   Social Connection and Isolation Panel [NHANES]    Frequency of Communication with Friends and Family: More than three times a week    Frequency of Social Gatherings with Friends and Family: More than three times a week    Attends Religious Services: 1 to 4 times per year    Active Member of [employer] or Organizations: No    Attends Banker Meetings: Never    Marital Status: Married  Catering manager Violence: Not At Risk (12/13/2022)   Humiliation, Afraid, Rape, and Kick questionnaire    Fear of Current or Ex-Partner: No    Emotionally Abused: No    Physically Abused: No    Sexually Abused: No    Outpatient Encounter Medications as of 05/18/2023  Medication Sig   aspirin EC 81 MG tablet Take 81 mg by mouth daily.   atorvastatin (LIPITOR) 80 MG tablet Take 1 tablet (80 mg total) by mouth at bedtime.   Cholecalciferol 50 MCG (2000 UT) CAPS Take by mouth.   escitalopram (LEXAPRO) 10 MG tablet Take 1 tablet (10 mg total) by mouth daily.   folic acid (FOLVITE) 1 MG tablet Take by mouth.   ibuprofen (ADVIL) 200 MG tablet Take by mouth.   levocetirizine (XYZAL) 5 MG tablet Take 1 tablet (5 mg total) by mouth every evening.   lisinopril (ZESTRIL)  10 MG tablet Take 1 tablet (10 mg total) by mouth daily.   metoprolol succinate (TOPROL-XL) 50 MG 24 hr tablet Take 1 tablet (50 mg total) by mouth daily.   omega-3 fish oil (MAXEPA) 1000 MG CAPS capsule Take 1 capsule by mouth  daily.   omeprazole (PRILOSEC) 20 MG capsule Take 20 mg by mouth daily.   Semaglutide, 1 MG/DOSE, (OZEMPIC, 1 MG/DOSE,) 4 MG/3ML SOPN INJECT 1 MG SUBCUTANEOUSLY ONCE A WEEK   [DISCONTINUED] escitalopram (LEXAPRO) 5 MG tablet Take 1 tablet (5 mg total) by mouth daily.   No facility-administered encounter medications on file as of 05/18/2023.    No Known Allergies  Pertinent ROS per HPI, otherwise unremarkable      Objective:  BP 125/70   Pulse 68   Temp (!) 97.2 F (36.2 C) (Temporal)   Ht 5\' 5"  (1.651 m)   Wt 218 lb 3.2 oz (99 kg)   SpO2 92%   BMI 36.31 kg/m    Wt Readings from Last 3 Encounters:  05/18/23 218 lb 3.2 oz (99 kg)  04/06/23 220 lb 9.6 oz (100.1 kg)  01/18/23 224 lb (101.6 kg)    Physical Exam Vitals and nursing note reviewed.  Constitutional:      General: She is not in acute distress.    Appearance: Normal appearance. She is obese. She is not ill-appearing, toxic-appearing or diaphoretic.  HENT:     Head: Normocephalic and atraumatic.     Mouth/Throat:     Mouth: Mucous membranes are moist.  Eyes:     Conjunctiva/sclera: Conjunctivae normal.     Pupils: Pupils are equal, round, and reactive to light.  Cardiovascular:     Rate and Rhythm: Normal rate and regular rhythm.     Heart sounds: Normal heart sounds.  Pulmonary:     Effort: Pulmonary effort is normal.     Breath sounds: Normal breath sounds.  Abdominal:     General: Bowel sounds are normal.     Palpations: Abdomen is soft.     Hernia: A hernia is present. Hernia is present in the umbilical area.  Skin:    General: Skin is warm and dry.     Capillary Refill: Capillary refill takes less than 2 seconds.  Neurological:     General: No focal deficit present.      Mental Status: She is alert and oriented to person, place, and time.  Psychiatric:        Mood and Affect: Mood normal.        Behavior: Behavior normal.        Thought Content: Thought content normal.        Judgment: Judgment normal.    Physical Exam   ABDOMEN: Hernia present, soft, reducible, no pain on examination.        Results for orders placed or performed in visit on 04/06/23  CBC with Differential/Platelet  Result Value Ref Range   WBC 5.8 3.4 - 10.8 x10E3/uL   RBC 4.28 3.77 - 5.28 x10E6/uL   Hemoglobin 13.0 11.1 - 15.9 g/dL   Hematocrit 16.1 09.6 - 46.6 %   MCV 93 79 - 97 fL   MCH 30.4 26.6 - 33.0 pg   MCHC 32.7 31.5 - 35.7 g/dL   RDW 04.5 40.9 - 81.1 %   Platelets 212 150 - 450 x10E3/uL   Neutrophils 55 Not Estab. %   Lymphs 38 Not Estab. %   Monocytes 5 Not Estab. %   Eos 1 Not Estab. %   Basos 1 Not Estab. %   Neutrophils Absolute 3.2 1.4 - 7.0 x10E3/uL   Lymphocytes Absolute 2.2 0.7 - 3.1 x10E3/uL   Monocytes Absolute 0.3 0.1 - 0.9 x10E3/uL   EOS (ABSOLUTE) 0.1 0.0 - 0.4 x10E3/uL  Basophils Absolute 0.0 0.0 - 0.2 x10E3/uL   Immature Granulocytes 0 Not Estab. %   Immature Grans (Abs) 0.0 0.0 - 0.1 x10E3/uL  CMP14+EGFR  Result Value Ref Range   Glucose 128 (H) 70 - 99 mg/dL   BUN 10 8 - 27 mg/dL   Creatinine, Ser 5.73 (H) 0.57 - 1.00 mg/dL   eGFR 51 (L) >22 GU/RKY/7.06   BUN/Creatinine Ratio 9 (L) 12 - 28   Sodium 135 134 - 144 mmol/L   Potassium 4.7 3.5 - 5.2 mmol/L   Chloride 98 96 - 106 mmol/L   CO2 21 20 - 29 mmol/L   Calcium 10.0 8.7 - 10.3 mg/dL   Total Protein 7.3 6.0 - 8.5 g/dL   Albumin 4.6 3.8 - 4.8 g/dL   Globulin, Total 2.7 1.5 - 4.5 g/dL   Bilirubin Total 0.7 0.0 - 1.2 mg/dL   Alkaline Phosphatase 94 44 - 121 IU/L   AST 21 0 - 40 IU/L   ALT 20 0 - 32 IU/L  Thyroid Panel With TSH  Result Value Ref Range   TSH 3.010 0.450 - 4.500 uIU/mL   T4, Total 9.2 4.5 - 12.0 ug/dL   T3 Uptake Ratio 26 24 - 39 %   Free Thyroxine Index 2.4 1.2 -  4.9  Bayer DCA Hb A1c Waived  Result Value Ref Range   HB A1C (BAYER DCA - WAIVED) 6.9 (H) 4.8 - 5.6 %       Pertinent labs & imaging results that were available during my care of the patient were reviewed by me and considered in my medical decision making.  Assessment & Plan:  Kristina "Scarlette Calico" was seen today for depression.  Diagnoses and all orders for this visit:  Depression, major, single episode, mild (HCC) -     escitalopram (LEXAPRO) 10 MG tablet; Take 1 tablet (10 mg total) by mouth daily.  Umbilical hernia without obstruction and without gangrene -     Ambulatory referral to General Surgery  Type 2 diabetes mellitus with stage 4 chronic kidney disease, without long-term current use of insulin (HCC) Ozempic samples provided in office     Assessment and Plan    Depression Mild improvement in symptoms after starting Lexapro 5mg  daily. No reported side effects. -Increase Lexapro to 10mg  daily. -Follow-up in mid-December to reassess response to increased dose.  Type 2 Diabetes Last A1c checked last month. Current medication, Ozempic, has become unaffordable due to patient reaching the Medicare Part D coverage gap (donut hole). -Provide patient with Ozempic samples. -Consider patient assistance programs or coupons for Ozempic once out of the coverage gap in January.  Incisional Hernia History of incisional hernia post-hysterectomy. Recently increased in size and caused discomfort with physical activity. No current pain or signs of incarceration. -Refer to general surgery for evaluation and potential repair. -Instruct patient to seek immediate medical attention if hernia becomes hard, painful, or tender.  Follow-up Schedule follow-up appointment in mid-December for diabetes management and to assess response to increased Lexapro dose.          Continue all other maintenance medications.  Follow up plan: Return in about 8 weeks (around 07/13/2023), or if symptoms  worsen or fail to improve, for DM, Depression .   Continue healthy lifestyle choices, including diet (rich in fruits, vegetables, and lean proteins, and low in salt and simple carbohydrates) and exercise (at least 30 minutes of moderate physical activity daily).  Educational handout given for depression and BH resources  The above  assessment and management plan was discussed with the patient. The patient verbalized understanding of and has agreed to the management plan. Patient is aware to call the clinic if they develop any new symptoms or if symptoms persist or worsen. Patient is aware when to return to the clinic for a follow-up visit. Patient educated on when it is appropriate to go to the emergency department.   Kari Baars, FNP-C [facility] [phone]

## 2023-05-18 NOTE — Patient Instructions (Signed)

## 2023-05-23 ENCOUNTER — Encounter: Payer: Self-pay | Admitting: *Deleted

## 2023-06-27 ENCOUNTER — Encounter: Payer: Self-pay | Admitting: General Surgery

## 2023-06-27 ENCOUNTER — Ambulatory Visit: Payer: Medicare HMO | Admitting: General Surgery

## 2023-06-27 VITALS — BP 133/80 | HR 69 | Temp 97.7°F | Resp 14 | Ht 65.0 in | Wt 218.0 lb

## 2023-06-27 DIAGNOSIS — K432 Incisional hernia without obstruction or gangrene: Secondary | ICD-10-CM

## 2023-06-27 NOTE — Progress Notes (Signed)
Kristina Peterson; [id number]; 1946/06/18   HPI Patient is a 77 year old white female who was referred to my care by Gilford Silvius for evaluation and treatment of an incisional hernia.  Patient is status post robotic assisted laparoscopic hysterectomy in 2016.  She states that she has had periumbilical swelling at one of the incision sites for some time now.  She denies any abdominal pain or episodes of incarceration.  It is made worse with straining.  It does not bother her at the present time. Past Medical History:  Diagnosis Date   Diabetes mellitus without complication (HCC)    Hypertension     Past Surgical History:  Procedure Laterality Date   DILATION AND CURETTAGE OF UTERUS     HYSTEROSCOPY WITH D & C N/A 02/13/2015   Procedure: HYSTEROSCOPY, UTERINE CURETTAGE;  Surgeon: Lazaro Arms, MD;  Location: [facility];  Service: Gynecology;  Laterality: N/A;    Family History  Problem Relation Age of Onset   Cancer Mother        cervical   Cancer Maternal Aunt        lung   Cancer Maternal Uncle    Diabetes Maternal Grandmother    Heart disease Paternal Grandfather     Current Outpatient Medications on File Prior to Visit  Medication Sig Dispense Refill   aspirin EC 81 MG tablet Take 81 mg by mouth daily.     atorvastatin (LIPITOR) 80 MG tablet Take 1 tablet (80 mg total) by mouth at bedtime. 90 tablet 1   Cholecalciferol 50 MCG (2000 UT) CAPS Take by mouth.     escitalopram (LEXAPRO) 10 MG tablet Take 1 tablet (10 mg total) by mouth daily. 90 tablet 5   folic acid (FOLVITE) 1 MG tablet Take by mouth.     ibuprofen (ADVIL) 200 MG tablet Take by mouth.     levocetirizine (XYZAL) 5 MG tablet Take 1 tablet (5 mg total) by mouth every evening. 90 tablet 1   lisinopril (ZESTRIL) 10 MG tablet Take 1 tablet (10 mg total) by mouth daily. 90 tablet 1   metoprolol succinate (TOPROL-XL) 50 MG 24 hr tablet Take 1 tablet (50 mg total) by mouth daily. 90 tablet 1   omega-3 fish oil (MAXEPA) 1000 MG  CAPS capsule Take 1 capsule by mouth daily.     omeprazole (PRILOSEC) 20 MG capsule Take 20 mg by mouth daily.     Semaglutide, 1 MG/DOSE, (OZEMPIC, 1 MG/DOSE,) 4 MG/3ML SOPN INJECT 1 MG SUBCUTANEOUSLY ONCE A WEEK 9 mL 0   No current facility-administered medications on file prior to visit.    No Known Allergies  Social History   Substance and Sexual Activity  Alcohol Use No   Alcohol/week: 0.0 standard drinks of alcohol    Social History   Tobacco Use  Smoking Status Never  Smokeless Tobacco Never    Review of Systems  Constitutional: Negative.   HENT: Negative.    Eyes: Negative.   Respiratory:  Positive for cough.   Cardiovascular: Negative.   Gastrointestinal:  Positive for heartburn.  Genitourinary: Negative.   Musculoskeletal:  Positive for joint pain.  Skin: Negative.   Neurological:  Positive for dizziness.  Endo/Heme/Allergies: Negative.   Psychiatric/Behavioral: Negative.      Objective   Vitals:   06/27/23 1006  BP: 133/80  Pulse: 69  Resp: 14  Temp: 97.7 F (36.5 C)  SpO2: 94%    Physical Exam Vitals reviewed.  Constitutional:      Appearance:  Normal appearance. She is not ill-appearing.  HENT:     Head: Normocephalic and atraumatic.  Cardiovascular:     Rate and Rhythm: Normal rate and regular rhythm.     Heart sounds: Normal heart sounds. No murmur heard.    No friction rub. No gallop.  Pulmonary:     Effort: Pulmonary effort is normal. No respiratory distress.     Breath sounds: Normal breath sounds. No stridor. No wheezing, rhonchi or rales.  Abdominal:     General: Bowel sounds are normal. There is no distension.     Palpations: Abdomen is soft. There is no mass.     Tenderness: There is no abdominal tenderness. There is no guarding or rebound.     Hernia: A hernia is present.     Comments: Easily reducible supraumbilical hernia, 4 to 5 cm in size below a surgical scar.  Skin:    General: Skin is warm and dry.  Neurological:      Mental Status: She is alert and oriented to person, place, and time.     Assessment  Incisional hernia, currently asymptomatic.  Patient has never had an episode of incarceration. Plan  As the patient is currently asymptomatic, there is no need for repair of the hernia.  Should she become more symptomatic, I told her to return to my care for consideration of surgical repair.  The signs and symptoms of incarceration were explained to the patient.  She was told to go the emergency room should she not be able to reduce the hernia.  She understands and agrees.  Literature was given.  Follow-up as needed.

## 2023-07-13 ENCOUNTER — Ambulatory Visit (INDEPENDENT_AMBULATORY_CARE_PROVIDER_SITE_OTHER): Payer: Medicare HMO | Admitting: Family Medicine

## 2023-07-13 ENCOUNTER — Encounter: Payer: Self-pay | Admitting: Family Medicine

## 2023-07-13 ENCOUNTER — Ambulatory Visit: Payer: Medicare HMO | Admitting: Family Medicine

## 2023-07-13 VITALS — BP 134/79 | HR 79 | Temp 97.0°F | Ht 65.0 in | Wt 214.6 lb

## 2023-07-13 DIAGNOSIS — E1122 Type 2 diabetes mellitus with diabetic chronic kidney disease: Secondary | ICD-10-CM

## 2023-07-13 DIAGNOSIS — F320 Major depressive disorder, single episode, mild: Secondary | ICD-10-CM | POA: Diagnosis not present

## 2023-07-13 DIAGNOSIS — E1169 Type 2 diabetes mellitus with other specified complication: Secondary | ICD-10-CM | POA: Diagnosis not present

## 2023-07-13 DIAGNOSIS — E785 Hyperlipidemia, unspecified: Secondary | ICD-10-CM

## 2023-07-13 DIAGNOSIS — I152 Hypertension secondary to endocrine disorders: Secondary | ICD-10-CM

## 2023-07-13 DIAGNOSIS — E1159 Type 2 diabetes mellitus with other circulatory complications: Secondary | ICD-10-CM

## 2023-07-13 DIAGNOSIS — Z7985 Long-term (current) use of injectable non-insulin antidiabetic drugs: Secondary | ICD-10-CM

## 2023-07-13 DIAGNOSIS — N184 Chronic kidney disease, stage 4 (severe): Secondary | ICD-10-CM

## 2023-07-13 DIAGNOSIS — K432 Incisional hernia without obstruction or gangrene: Secondary | ICD-10-CM

## 2023-07-13 LAB — BAYER DCA HB A1C WAIVED: HB A1C (BAYER DCA - WAIVED): 6.5 % — ABNORMAL HIGH (ref 4.8–5.6)

## 2023-07-13 MED ORDER — SEMAGLUTIDE (1 MG/DOSE) 4 MG/3ML ~~LOC~~ SOPN: 1.0000 mg | PEN_INJECTOR | SUBCUTANEOUS | 3 refills | Status: DC

## 2023-07-13 MED ORDER — METOPROLOL SUCCINATE ER 50 MG PO TB24: 50.0000 mg | ORAL_TABLET | Freq: Every day | ORAL | 1 refills | Status: DC

## 2023-07-13 MED ORDER — ATORVASTATIN CALCIUM 80 MG PO TABS: 80.0000 mg | ORAL_TABLET | Freq: Every day | ORAL | 1 refills | Status: DC

## 2023-07-13 MED ORDER — LISINOPRIL 10 MG PO TABS
10.0000 mg | ORAL_TABLET | Freq: Every day | ORAL | 1 refills | Status: DC
Start: 2023-07-13 — End: 2024-01-23

## 2023-07-13 NOTE — Patient Instructions (Signed)

## 2023-07-13 NOTE — Progress Notes (Signed)
Subjective:  Patient ID: Kristina Peterson, female    DOB: [birth year], 77 y.o.   MRN: [id number]  Patient Care Team: Sonny Masters, FNP as PCP - General (Family Medicine) [facility]   Chief Complaint:  Diabetes and Depression (8 week follow up - states it is better )   HPI: Kristina Peterson is a 77 y.o. female presenting on 07/13/2023 for Diabetes and Depression (8 week follow up - states it is better )   Discussed the use of AI scribe software for clinical note transcription with the patient, who gave verbal consent to proceed.  History of Present Illness   The patient, with a history of diabetes, hypertension, and hyperlipidemia, has been managing their blood sugars with Ozempic. They report that their blood sugars have been "okay" and "about the same." They are currently on a dose of 1mg  of Ozempic weekly, which they tolerate well with occasional heartburn managed with omeprazole.  They also report a recent fall where they landed on their arm. Following the fall, they noticed a more pronounced bone in their collarbone area. They experienced arm pain for a while after the fall, but it has since resolved. They deny any current pain or discomfort in the area.  The patient also has a history of hernia, which they report is not causing any significant issues. They deny any constipation or other related symptoms. They are also on Lexapro, which they report is working well for them.  They have been experiencing some muscle aches and pains, which they attribute to "old age." They report that these pains are not significant and are part of their "everyday life." They deny any visual changes, weakness, confusion, or chest pain. They also deny any swelling in their legs or feet.           07/13/2023   10:16 AM 05/18/2023   10:36 AM 04/06/2023    2:28 PM 01/18/2023    3:08 PM 12/13/2022   11:15 AM  Depression screen PHQ 2/9  Decreased Interest 1 1 1 2  0  Down, Depressed, Hopeless 0  0 0 1 1  PHQ - 2 Score 1 1 1 3 1   Altered sleeping 0 1 1 0   Tired, decreased energy 1 1 1 1    Change in appetite 0 1 0 0   Feeling bad or failure about yourself  0 0 0 1   Trouble concentrating 0 0 0 1   Moving slowly or fidgety/restless 0 0 0 0   Suicidal thoughts 0 0 0 0   PHQ-9 Score 2 4 3 6    Difficult doing work/chores Not difficult at all Not difficult at all Not difficult at all Somewhat difficult       07/13/2023   10:17 AM 05/18/2023   10:36 AM 04/06/2023    2:28 PM 01/18/2023    3:09 PM  GAD 7 : Generalized Anxiety Score  Nervous, Anxious, on Edge 0 0 1 0  Control/stop worrying 0 0 0 0  Worry too much - different things 0 0 0 1  Trouble relaxing 0 0 0 0  Restless 0 0 0 0  Easily annoyed or irritable 0 0 0 1  Afraid - awful might happen 0 0 0 0  Total GAD 7 Score 0 0 1 2  Anxiety Difficulty Not difficult at all Not difficult at all Not difficult at all Not difficult at all       Relevant past medical,  surgical, family, and social history reviewed and updated as indicated.  Allergies and medications reviewed and updated. Data reviewed: Chart in Epic.   Past Medical History:  Diagnosis Date   Diabetes mellitus without complication (HCC)    Hypertension     Past Surgical History:  Procedure Laterality Date   DILATION AND CURETTAGE OF UTERUS     HYSTEROSCOPY WITH D & C N/A 02/13/2015   Procedure: HYSTEROSCOPY, UTERINE CURETTAGE;  Surgeon: Lazaro Arms, MD;  Location: [facility];  Service: Gynecology;  Laterality: N/A;    Social History   Socioeconomic History   Marital status: Married    Spouse name: Not on file   Number of children: Not on file   Years of education: Not on file   Highest education level: Not on file  Occupational History   Not on file  Tobacco Use   Smoking status: Never   Smokeless tobacco: Never  Vaping Use   Vaping status: Never Used  Substance and Sexual Activity   Alcohol use: No    Alcohol/week: 0.0 standard drinks of alcohol    Drug use: No   Sexual activity: Not Currently  Other Topics Concern   Not on file  Social History Narrative   Not on file   Social Drivers of Health   Financial Resource Strain: Low Risk  (12/13/2022)   Overall Financial Resource Strain (CARDIA)    Difficulty of Paying Living Expenses: Not hard at all  Food Insecurity: No Food Insecurity (12/13/2022)   Hunger Vital Sign    Worried About Running Out of Food in the Last Year: Never true    Ran Out of Food in the Last Year: Never true  Transportation Needs: No Transportation Needs (12/13/2022)   PRAPARE - Administrator, Civil Service (Medical): No    Lack of Transportation (Non-Medical): No  Physical Activity: Insufficiently Active (12/13/2022)   Exercise Vital Sign    Days of Exercise per Week: 1 day    Minutes of Exercise per Session: 10 min  Stress: No Stress Concern Present (12/13/2022)   [employer] of Occupational Health - Occupational Stress Questionnaire    Feeling of Stress : Only a little  Social Connections: Moderately Integrated (12/13/2022)   Social Connection and Isolation Panel [NHANES]    Frequency of Communication with Friends and Family: More than three times a week    Frequency of Social Gatherings with Friends and Family: More than three times a week    Attends Religious Services: 1 to 4 times per year    Active Member of [employer] or Organizations: No    Attends Banker Meetings: Never    Marital Status: Married  Catering manager Violence: Not At Risk (12/13/2022)   Humiliation, Afraid, Rape, and Kick questionnaire    Fear of Current or Ex-Partner: No    Emotionally Abused: No    Physically Abused: No    Sexually Abused: No    Outpatient Encounter Medications as of 07/13/2023  Medication Sig   aspirin EC 81 MG tablet Take 81 mg by mouth daily.   Cholecalciferol 50 MCG (2000 UT) CAPS Take by mouth.   escitalopram (LEXAPRO) 10 MG tablet Take 1 tablet (10 mg total) by mouth daily.    folic acid (FOLVITE) 1 MG tablet Take by mouth.   ibuprofen (ADVIL) 200 MG tablet Take by mouth.   levocetirizine (XYZAL) 5 MG tablet Take 1 tablet (5 mg total) by mouth every evening.   omega-3 fish  oil (MAXEPA) 1000 MG CAPS capsule Take 1 capsule by mouth daily.   omeprazole (PRILOSEC) 20 MG capsule Take 20 mg by mouth daily.   Semaglutide, 1 MG/DOSE, 4 MG/3ML SOPN Inject 1 mg as directed once a week.   [DISCONTINUED] atorvastatin (LIPITOR) 80 MG tablet Take 1 tablet (80 mg total) by mouth at bedtime.   [DISCONTINUED] lisinopril (ZESTRIL) 10 MG tablet Take 1 tablet (10 mg total) by mouth daily.   [DISCONTINUED] metoprolol succinate (TOPROL-XL) 50 MG 24 hr tablet Take 1 tablet (50 mg total) by mouth daily.   [DISCONTINUED] Semaglutide, 1 MG/DOSE, (OZEMPIC, 1 MG/DOSE,) 4 MG/3ML SOPN INJECT 1 MG SUBCUTANEOUSLY ONCE A WEEK   atorvastatin (LIPITOR) 80 MG tablet Take 1 tablet (80 mg total) by mouth at bedtime.   lisinopril (ZESTRIL) 10 MG tablet Take 1 tablet (10 mg total) by mouth daily.   metoprolol succinate (TOPROL-XL) 50 MG 24 hr tablet Take 1 tablet (50 mg total) by mouth daily.   No facility-administered encounter medications on file as of 07/13/2023.    No Known Allergies  Pertinent ROS per HPI, otherwise unremarkable      Objective:  BP 134/79   Pulse 79   Temp (!) 97 F (36.1 C)   Ht 5\' 5"  (1.651 m)   Wt 214 lb 9.6 oz (97.3 kg)   SpO2 93%   BMI 35.71 kg/m    Wt Readings from Last 3 Encounters:  07/13/23 214 lb 9.6 oz (97.3 kg)  06/27/23 218 lb (98.9 kg)  05/18/23 218 lb 3.2 oz (99 kg)    Physical Exam Vitals and nursing note reviewed.  Constitutional:      General: She is not in acute distress.    Appearance: Normal appearance. She is well-developed and well-groomed. She is obese. She is not ill-appearing, toxic-appearing or diaphoretic.  HENT:     Head: Normocephalic and atraumatic.     Jaw: There is normal jaw occlusion.     Right Ear: Hearing normal.      Left Ear: Hearing normal.     Nose: Nose normal.     Mouth/Throat:     Lips: Pink.     Mouth: Mucous membranes are moist.     Pharynx: Oropharynx is clear. Uvula midline.  Eyes:     General: Lids are normal.     Extraocular Movements: Extraocular movements intact.     Conjunctiva/sclera: Conjunctivae normal.     Pupils: Pupils are equal, round, and reactive to light.  Neck:     Thyroid: No thyroid mass, thyromegaly or thyroid tenderness.     Vascular: No carotid bruit or JVD.     Trachea: Trachea and phonation normal.  Cardiovascular:     Rate and Rhythm: Normal rate and regular rhythm.     Chest Wall: PMI is not displaced.     Pulses: Normal pulses.     Heart sounds: Normal heart sounds. No murmur heard.    No friction rub. No gallop.  Pulmonary:     Effort: Pulmonary effort is normal. No respiratory distress.     Breath sounds: Normal breath sounds. No wheezing.  Abdominal:     General: Bowel sounds are normal.     Palpations: Abdomen is soft.     Hernia: A hernia is present. Hernia is present in the umbilical area.  Musculoskeletal:        General: Normal range of motion.     Cervical back: Normal range of motion and neck supple.  Right lower leg: No edema.     Left lower leg: No edema.  Lymphadenopathy:     Cervical: No cervical adenopathy.  Skin:    General: Skin is warm and dry.     Capillary Refill: Capillary refill takes less than 2 seconds.     Coloration: Skin is not cyanotic, jaundiced or pale.     Findings: No rash.  Neurological:     General: No focal deficit present.     Mental Status: She is alert and oriented to person, place, and time.     Sensory: Sensation is intact.     Motor: Motor function is intact.     Coordination: Coordination is intact.     Gait: Gait is intact.     Deep Tendon Reflexes: Reflexes are normal and symmetric.  Psychiatric:        Attention and Perception: Attention and perception normal.        Mood and Affect: Mood and  affect normal.        Speech: Speech normal.        Behavior: Behavior normal. Behavior is cooperative.        Thought Content: Thought content normal.        Cognition and Memory: Cognition and memory normal.        Judgment: Judgment normal.     Results for orders placed or performed in visit on 04/06/23  Bayer DCA Hb A1c Waived   Collection Time: 04/06/23  2:46 PM  Result Value Ref Range   HB A1C (BAYER DCA - WAIVED) 6.9 (H) 4.8 - 5.6 %  CBC with Differential/Platelet   Collection Time: 04/06/23  2:48 PM  Result Value Ref Range   WBC 5.8 3.4 - 10.8 x10E3/uL   RBC 4.28 3.77 - 5.28 x10E6/uL   Hemoglobin 13.0 11.1 - 15.9 g/dL   Hematocrit 46.9 62.9 - 46.6 %   MCV 93 79 - 97 fL   MCH 30.4 26.6 - 33.0 pg   MCHC 32.7 31.5 - 35.7 g/dL   RDW 52.8 41.3 - 24.4 %   Platelets 212 150 - 450 x10E3/uL   Neutrophils 55 Not Estab. %   Lymphs 38 Not Estab. %   Monocytes 5 Not Estab. %   Eos 1 Not Estab. %   Basos 1 Not Estab. %   Neutrophils Absolute 3.2 1.4 - 7.0 x10E3/uL   Lymphocytes Absolute 2.2 0.7 - 3.1 x10E3/uL   Monocytes Absolute 0.3 0.1 - 0.9 x10E3/uL   EOS (ABSOLUTE) 0.1 0.0 - 0.4 x10E3/uL   Basophils Absolute 0.0 0.0 - 0.2 x10E3/uL   Immature Granulocytes 0 Not Estab. %   Immature Grans (Abs) 0.0 0.0 - 0.1 x10E3/uL  CMP14+EGFR   Collection Time: 04/06/23  2:48 PM  Result Value Ref Range   Glucose 128 (H) 70 - 99 mg/dL   BUN 10 8 - 27 mg/dL   Creatinine, Ser 0.10 (H) 0.57 - 1.00 mg/dL   eGFR 51 (L) >27 OZ/DGU/4.40   BUN/Creatinine Ratio 9 (L) 12 - 28   Sodium 135 134 - 144 mmol/L   Potassium 4.7 3.5 - 5.2 mmol/L   Chloride 98 96 - 106 mmol/L   CO2 21 20 - 29 mmol/L   Calcium 10.0 8.7 - 10.3 mg/dL   Total Protein 7.3 6.0 - 8.5 g/dL   Albumin 4.6 3.8 - 4.8 g/dL   Globulin, Total 2.7 1.5 - 4.5 g/dL   Bilirubin Total 0.7 0.0 - 1.2 mg/dL   Alkaline Phosphatase  94 44 - 121 IU/L   AST 21 0 - 40 IU/L   ALT 20 0 - 32 IU/L  Thyroid Panel With TSH   Collection Time:  04/06/23  2:48 PM  Result Value Ref Range   TSH 3.010 0.450 - 4.500 uIU/mL   T4, Total 9.2 4.5 - 12.0 ug/dL   T3 Uptake Ratio 26 24 - 39 %   Free Thyroxine Index 2.4 1.2 - 4.9       Pertinent labs & imaging results that were available during my care of the patient were reviewed by me and considered in my medical decision making.  Assessment & Plan:  Tenaya "Scarlette Calico" was seen today for diabetes and depression.  Diagnoses and all orders for this visit:  Type 2 diabetes mellitus with other specified complication, without long-term current use of insulin (HCC) -     Bayer DCA Hb A1c Waived -     Microalbumin / creatinine urine ratio -     Semaglutide, 1 MG/DOSE, 4 MG/3ML SOPN; Inject 1 mg as directed once a week.  Hypertension associated with diabetes (HCC) -     CMP14+EGFR -     lisinopril (ZESTRIL) 10 MG tablet; Take 1 tablet (10 mg total) by mouth daily. -     metoprolol succinate (TOPROL-XL) 50 MG 24 hr tablet; Take 1 tablet (50 mg total) by mouth daily.  Hyperlipidemia associated with type 2 diabetes mellitus (HCC) -     atorvastatin (LIPITOR) 80 MG tablet; Take 1 tablet (80 mg total) by mouth at bedtime.  CKD stage 4 due to type 2 diabetes mellitus (HCC) -     CMP14+EGFR  Depression, major, single episode, mild (HCC) Doing well on current regimen.  Morbid obesity (HCC) -     CMP14+EGFR -     Bayer DCA Hb A1c Waived  Incisional hernia, without obstruction or gangrene Saw surgeon and will monitor hernia at this time as it is not problematic.     Assessment and Plan    Type 2 Diabetes Mellitus Blood sugars are well-controlled on Ozempic 1 mg weekly. A1c is 6.5. Discussed cost and provided samples through January. - Continue Ozempic 1 mg weekly - Provide samples of Ozempic through January - Follow-up in 3 months  Hypertension Well-managed on lisinopril and metoprolol with no reported issues. - Continue lisinopril and metoprolol  Hyperlipidemia Well-managed on  atorvastatin (Lipitor) with no significant muscle aches or pains. - Continue atorvastatin (Lipitor)  Depression Well-controlled on Lexapro with current dose. - Continue Lexapro  Clavicle Fracture Reports a fall resulting in a more pronounced clavicle. No current pain or functional impairment. Likely healed with new ossification. No intervention needed unless symptoms develop. - Monitor for new symptoms or pain - Consider x-ray if symptoms develop  Hernia Asymptomatic hernia. Advised to monitor for changes or complications. - Monitor for symptoms such as pain or constipation - Follow-up with Dr. Lovell Sheehan if problems arise  General Health Maintenance Up-to-date with current medications and management plans. - Continue current management and medications - Follow-up in 3 months.          Continue all other maintenance medications.  Follow up plan: Return if symptoms worsen or fail to improve.   Continue healthy lifestyle choices, including diet (rich in fruits, vegetables, and lean proteins, and low in salt and simple carbohydrates) and exercise (at least 30 minutes of moderate physical activity daily).  Educational handout given for DM  The above assessment and management plan was discussed with the  patient. The patient verbalized understanding of and has agreed to the management plan. Patient is aware to call the clinic if they develop any new symptoms or if symptoms persist or worsen. Patient is aware when to return to the clinic for a follow-up visit. Patient educated on when it is appropriate to go to the emergency department.   Kari Baars, FNP-C [facility] [phone]

## 2023-07-14 LAB — CMP14+EGFR
ALT: 23 [IU]/L (ref 0–32)
AST: 20 [IU]/L (ref 0–40)
Albumin: 4.3 g/dL (ref 3.8–4.8)
Alkaline Phosphatase: 92 [IU]/L (ref 44–121)
BUN/Creatinine Ratio: 12 (ref 12–28)
BUN: 15 mg/dL (ref 8–27)
Bilirubin Total: 0.7 mg/dL (ref 0.0–1.2)
CO2: 21 mmol/L (ref 20–29)
Calcium: 9.8 mg/dL (ref 8.7–10.3)
Chloride: 99 mmol/L (ref 96–106)
Creatinine, Ser: 1.23 mg/dL — ABNORMAL HIGH (ref 0.57–1.00)
Globulin, Total: 2.4 g/dL (ref 1.5–4.5)
Glucose: 159 mg/dL — ABNORMAL HIGH (ref 70–99)
Potassium: 4.7 mmol/L (ref 3.5–5.2)
Sodium: 138 mmol/L (ref 134–144)
Total Protein: 6.7 g/dL (ref 6.0–8.5)
eGFR: 45 mL/min/{1.73_m2} — ABNORMAL LOW (ref >59)

## 2023-08-08 ENCOUNTER — Encounter: Payer: Self-pay | Admitting: Family Medicine

## 2023-08-30 ENCOUNTER — Telehealth: Payer: Self-pay

## 2023-08-30 NOTE — Telephone Encounter (Signed)
 Copied from CRM [id number]. Topic: Clinical - Medication Question >> Aug 30, 2023  9:13 AM Kristina Peterson wrote: Reason for CRM: Patient wants to know can she get a antibiotic for ear ache Started a few days ago. Started with sinus on left side and have been trying to treat at home by eating honey but not going away

## 2023-08-30 NOTE — Telephone Encounter (Signed)
 Spoke with pt- appt made

## 2023-08-31 ENCOUNTER — Encounter: Payer: Self-pay | Admitting: Family

## 2023-08-31 ENCOUNTER — Ambulatory Visit (INDEPENDENT_AMBULATORY_CARE_PROVIDER_SITE_OTHER): Payer: Medicare HMO | Admitting: Family

## 2023-08-31 VITALS — BP 136/88 | HR 100 | Temp 97.6°F | Wt 213.2 lb

## 2023-08-31 DIAGNOSIS — R52 Pain, unspecified: Secondary | ICD-10-CM

## 2023-08-31 DIAGNOSIS — R112 Nausea with vomiting, unspecified: Secondary | ICD-10-CM | POA: Diagnosis not present

## 2023-08-31 DIAGNOSIS — J029 Acute pharyngitis, unspecified: Secondary | ICD-10-CM | POA: Diagnosis not present

## 2023-08-31 LAB — CULTURE, GROUP A STREP

## 2023-08-31 LAB — VERITOR FLU A/B WAIVED
Influenza A: NEGATIVE
Influenza B: NEGATIVE

## 2023-08-31 LAB — RAPID STREP SCREEN (MED CTR MEBANE ONLY): Strep Gp A Ag, IA W/Reflex: NEGATIVE

## 2023-08-31 MED ORDER — ONDANSETRON HCL 4 MG PO TABS: 4.0000 mg | ORAL_TABLET | Freq: Three times a day (TID) | ORAL | 0 refills | Status: DC | PRN

## 2023-08-31 MED ORDER — AMOXICILLIN 500 MG PO CAPS: 500.0000 mg | ORAL_CAPSULE | Freq: Two times a day (BID) | ORAL | 0 refills | Status: DC

## 2023-08-31 NOTE — Patient Instructions (Signed)

## 2023-08-31 NOTE — Progress Notes (Signed)
 Subjective:    Patient ID: Kristina Peterson, female    DOB: May 17, 1946, 78 y.o.   MRN: [id number]  Chief Complaint  Patient presents with   Ear Pain    Started out with left side of head being sensitive to touch, now having ear pain, neck pain, left side tongue/throat of swollen. Started 1 week ago    Pt presents to the office today with ear pain that started a week ago.   She has started vomiting yesterday.  Otalgia  There is pain in the left ear. This is a new problem. The current episode started 1 to 4 weeks ago. The problem has been waxing and waning. There has been no fever. The pain is at a severity of 5/10. The pain is moderate. Associated symptoms include coughing, headaches, rhinorrhea, a sore throat and vomiting. Pertinent negatives include no diarrhea or ear discharge. She has tried acetaminophen  and NSAIDs for the symptoms. The treatment provided mild relief.  Emesis  This is a new problem. The current episode started yesterday. The problem occurs 2 to 4 times per day. The problem has been unchanged. The emesis has an appearance of stomach contents. Associated symptoms include chills, coughing and headaches. Pertinent negatives include no diarrhea. She has tried acetaminophen  for the symptoms. The treatment provided mild relief.      Review of Systems  Constitutional:  Positive for chills.  HENT:  Positive for ear pain, rhinorrhea and sore throat. Negative for ear discharge.   Respiratory:  Positive for cough.   Gastrointestinal:  Positive for vomiting. Negative for diarrhea.  Neurological:  Positive for headaches.  All other systems reviewed and are negative.   Social History   Socioeconomic History   Marital status: Married    Spouse name: Not on file   Number of children: Not on file   Years of education: Not on file   Highest education level: Not on file  Occupational History   Not on file  Tobacco Use   Smoking status: Never   Smokeless tobacco: Never  Vaping  Use   Vaping status: Never Used  Substance and Sexual Activity   Alcohol use: No    Alcohol/week: 0.0 standard drinks of alcohol   Drug use: No   Sexual activity: Not Currently  Other Topics Concern   Not on file  Social History Narrative   Not on file   Social Drivers of Health   Financial Resource Strain: Low Risk  (12/13/2022)   Overall Financial Resource Strain (CARDIA)    Difficulty of Paying Living Expenses: Not hard at all  Food Insecurity: No Food Insecurity (12/13/2022)   Hunger Vital Sign    Worried About Running Out of Food in the Last Year: Never true    Ran Out of Food in the Last Year: Never true  Transportation Needs: No Transportation Needs (12/13/2022)   PRAPARE - Administrator, Civil Service (Medical): No    Lack of Transportation (Non-Medical): No  Physical Activity: Insufficiently Active (12/13/2022)   Exercise Vital Sign    Days of Exercise per Week: 1 day    Minutes of Exercise per Session: 10 min  Stress: No Stress Concern Present (12/13/2022)   [employer] of Occupational Health - Occupational Stress Questionnaire    Feeling of Stress : Only a little  Social Connections: Moderately Integrated (12/13/2022)   Social Connection and Isolation Panel [NHANES]    Frequency of Communication with Friends and Family: More than three times a  week    Frequency of Social Gatherings with Friends and Family: More than three times a week    Attends Religious Services: 1 to 4 times per year    Active Member of [employer] or Organizations: No    Attends Banker Meetings: Never    Marital Status: Married   Family History  Problem Relation Age of Onset   Cancer Mother        cervical   Cancer Maternal Aunt        lung   Cancer Maternal Uncle    Diabetes Maternal Grandmother    Heart disease Paternal Grandfather         Objective:   Physical Exam Vitals reviewed.  Constitutional:      General: She is not in acute distress.     Appearance: She is well-developed.  HENT:     Head: Normocephalic and atraumatic.     Right Ear: There is impacted cerumen.     Left Ear: There is impacted cerumen.     Mouth/Throat:      Comments: White patches in left tonsil  Eyes:     Pupils: Pupils are equal, round, and reactive to light.  Neck:     Thyroid : No thyromegaly.  Cardiovascular:     Rate and Rhythm: Normal rate and regular rhythm.     Heart sounds: Normal heart sounds. No murmur heard. Pulmonary:     Effort: Pulmonary effort is normal. No respiratory distress.     Breath sounds: Normal breath sounds. No wheezing.  Abdominal:     General: Bowel sounds are normal. There is no distension.     Palpations: Abdomen is soft.     Tenderness: There is no abdominal tenderness.  Musculoskeletal:        General: No tenderness. Normal range of motion.     Cervical back: Normal range of motion and neck supple.  Skin:    General: Skin is warm and dry.  Neurological:     Mental Status: She is alert and oriented to person, place, and time.     Cranial Nerves: No cranial nerve deficit.     Deep Tendon Reflexes: Reflexes are normal and symmetric.  Psychiatric:        Behavior: Behavior normal.        Thought Content: Thought content normal.        Judgment: Judgment normal.       BP 136/88   Pulse 100   Temp 97.6 F (36.4 C)   Wt 213 lb 3.2 oz (96.7 kg)   SpO2 95%   BMI 35.48 kg/m      Assessment & Plan:  KODY VIGIL comes in today with chief complaint of Ear Pain (Started out with left side of head being sensitive to touch, now having ear pain, neck pain, left side tongue/throat of swollen. Started 1 week ago )   Diagnosis and orders addressed:  1. Body aches (Primary) - Veritor Flu A/B Waived - Rapid Strep Screen ([facility] ONLY)  2. Sore throat  3. Nausea and vomiting, unspecified vomiting type - ondansetron  (ZOFRAN ) 4 MG tablet; Take 1 tablet (4 mg total) by mouth every 8 (eight) hours as needed  for nausea or vomiting.  Dispense: 20 tablet; Refill: 0  4. Acute pharyngitis, unspecified etiology - Take meds as prescribed - Use a cool mist humidifier  -Use saline nose sprays frequently -Force fluids -For any cough or congestion  Use plain Mucinex- regular strength  or max strength is fine -For fever or aces or pains- take tylenol  or ibuprofen. -Throat lozenges if help -New toothbrush in 3 days Zofran  as needed - amoxicillin  (AMOXIL ) 500 MG capsule; Take 1 capsule (500 mg total) by mouth 2 (two) times daily for 10 days.  Dispense: 20 capsule; Refill: 0    Bari Learn, FNP

## 2023-09-03 ENCOUNTER — Other Ambulatory Visit: Payer: Self-pay

## 2023-09-03 ENCOUNTER — Emergency Department (HOSPITAL_COMMUNITY)
Admission: EM | Admit: 2023-09-03 | Discharge: 2023-09-04 | Disposition: A | Discharge status: Home / Self Care | Payer: Medicare HMO | Attending: Emergency Medicine | Admitting: Emergency Medicine

## 2023-09-03 ENCOUNTER — Emergency Department (HOSPITAL_COMMUNITY): Payer: Medicare HMO

## 2023-09-03 DIAGNOSIS — Z20822 Contact with and (suspected) exposure to covid-19: Secondary | ICD-10-CM | POA: Insufficient documentation

## 2023-09-03 DIAGNOSIS — K5792 Diverticulitis of intestine, part unspecified, without perforation or abscess without bleeding: Secondary | ICD-10-CM

## 2023-09-03 DIAGNOSIS — I129 Hypertensive chronic kidney disease with stage 1 through stage 4 chronic kidney disease, or unspecified chronic kidney disease: Secondary | ICD-10-CM | POA: Diagnosis not present

## 2023-09-03 DIAGNOSIS — R42 Dizziness and giddiness: Secondary | ICD-10-CM | POA: Diagnosis present

## 2023-09-03 DIAGNOSIS — Z79899 Other long term (current) drug therapy: Secondary | ICD-10-CM | POA: Insufficient documentation

## 2023-09-03 DIAGNOSIS — K5732 Diverticulitis of large intestine without perforation or abscess without bleeding: Secondary | ICD-10-CM | POA: Insufficient documentation

## 2023-09-03 DIAGNOSIS — I2510 Atherosclerotic heart disease of native coronary artery without angina pectoris: Secondary | ICD-10-CM | POA: Insufficient documentation

## 2023-09-03 DIAGNOSIS — E1122 Type 2 diabetes mellitus with diabetic chronic kidney disease: Secondary | ICD-10-CM | POA: Insufficient documentation

## 2023-09-03 DIAGNOSIS — E1165 Type 2 diabetes mellitus with hyperglycemia: Secondary | ICD-10-CM | POA: Diagnosis not present

## 2023-09-03 DIAGNOSIS — J029 Acute pharyngitis, unspecified: Secondary | ICD-10-CM | POA: Insufficient documentation

## 2023-09-03 DIAGNOSIS — H6123 Impacted cerumen, bilateral: Secondary | ICD-10-CM | POA: Insufficient documentation

## 2023-09-03 DIAGNOSIS — Z7982 Long term (current) use of aspirin: Secondary | ICD-10-CM | POA: Diagnosis not present

## 2023-09-03 DIAGNOSIS — N189 Chronic kidney disease, unspecified: Secondary | ICD-10-CM | POA: Diagnosis not present

## 2023-09-03 LAB — URINALYSIS, ROUTINE W REFLEX MICROSCOPIC
Bacteria, UA: NONE SEEN
Bilirubin Urine: NEGATIVE
Glucose, UA: NEGATIVE mg/dL
Ketones, ur: NEGATIVE mg/dL
Nitrite: NEGATIVE
Protein, ur: NEGATIVE mg/dL
Specific Gravity, Urine: 1.02 (ref 1.005–1.030)
pH: 6 (ref 5.0–8.0)

## 2023-09-03 LAB — CBC WITH DIFFERENTIAL/PLATELET
Abs Immature Granulocytes: 0.02 10*3/uL (ref 0.00–0.07)
Basophils Absolute: 0 10*3/uL (ref 0.0–0.1)
Basophils Relative: 1 %
Eosinophils Absolute: 0 10*3/uL (ref 0.0–0.5)
Eosinophils Relative: 0 %
HCT: 39 % (ref 36.0–46.0)
Hemoglobin: 13.5 g/dL (ref 12.0–15.0)
Immature Granulocytes: 0 %
Lymphocytes Relative: 13 %
Lymphs Abs: 1 10*3/uL (ref 0.7–4.0)
MCH: 30.3 pg (ref 26.0–34.0)
MCHC: 34.6 g/dL (ref 30.0–36.0)
MCV: 87.4 fL (ref 80.0–100.0)
Monocytes Absolute: 0.3 10*3/uL (ref 0.1–1.0)
Monocytes Relative: 4 %
Neutro Abs: 6.2 10*3/uL (ref 1.7–7.7)
Neutrophils Relative %: 82 %
Platelets: 189 10*3/uL (ref 150–400)
RBC: 4.46 MIL/uL (ref 3.87–5.11)
RDW: 12.2 % (ref 11.5–15.5)
WBC: 7.6 10*3/uL (ref 4.0–10.5)
nRBC: 0 % (ref 0.0–0.2)

## 2023-09-03 LAB — CBG MONITORING, ED: Glucose-Capillary: 177 mg/dL — ABNORMAL HIGH (ref 70–99)

## 2023-09-03 LAB — COMPREHENSIVE METABOLIC PANEL
ALT: 20 U/L (ref 0–44)
AST: 19 U/L (ref 15–41)
Albumin: 3.9 g/dL (ref 3.5–5.0)
Alkaline Phosphatase: 61 U/L (ref 38–126)
Anion gap: 11 (ref 5–15)
BUN: 19 mg/dL (ref 8–23)
CO2: 27 mmol/L (ref 22–32)
Calcium: 9.6 mg/dL (ref 8.9–10.3)
Chloride: 94 mmol/L — ABNORMAL LOW (ref 98–111)
Creatinine, Ser: 1.15 mg/dL — ABNORMAL HIGH (ref 0.44–1.00)
GFR, Estimated: 49 mL/min — ABNORMAL LOW (ref >60)
Glucose, Bld: 186 mg/dL — ABNORMAL HIGH (ref 70–99)
Potassium: 4.3 mmol/L (ref 3.5–5.1)
Sodium: 132 mmol/L — ABNORMAL LOW (ref 135–145)
Total Bilirubin: 1 mg/dL (ref 0.0–1.2)
Total Protein: 7.5 g/dL (ref 6.5–8.1)

## 2023-09-03 LAB — RESP PANEL BY RT-PCR (RSV, FLU A&B, COVID)  RVPGX2
Influenza A by PCR: NEGATIVE
Influenza B by PCR: NEGATIVE
Resp Syncytial Virus by PCR: NEGATIVE
SARS Coronavirus 2 by RT PCR: NEGATIVE

## 2023-09-03 MED ORDER — IOHEXOL 300 MG/ML  SOLN
100.0000 mL | Freq: Once | INTRAMUSCULAR | Status: AC | PRN
  Administered 2023-09-03: 100 mL via INTRAVENOUS

## 2023-09-03 MED ORDER — MECLIZINE HCL 12.5 MG PO TABS
25.0000 mg | ORAL_TABLET | Freq: Once | ORAL | Status: AC
  Administered 2023-09-03: 25 mg via ORAL
  Filled 2023-09-03: qty 2

## 2023-09-03 MED ORDER — ONDANSETRON HCL 4 MG/2ML IJ SOLN
4.0000 mg | Freq: Once | INTRAMUSCULAR | Status: AC
  Administered 2023-09-03: 4 mg via INTRAVENOUS
  Filled 2023-09-03: qty 2

## 2023-09-03 MED ORDER — SODIUM CHLORIDE 0.9 % IV BOLUS
1000.0000 mL | Freq: Once | INTRAVENOUS | Status: AC
  Administered 2023-09-03: 1000 mL via INTRAVENOUS

## 2023-09-03 NOTE — ED Triage Notes (Signed)
 Pt states she was seen at PCP last week for an earache and sore throat. Was given antibiotics. Now reports feeling weak, dizziness with too much movement, and N/V.

## 2023-09-03 NOTE — ED Provider Notes (Signed)
  Provider Note MRN:  [id number]  Arrival date & time: 09/04/23    ED Course and Medical Decision Making  Assumed care of patient at sign-out or upon transfer.  78 year old female with abdominal pain awaiting CT abdomen.  Recent upper respiratory infection.  1:30 AM update: Patient is resting comfortably on reassessment with normal vital signs.  CT showing evidence of possible early diverticulitis.  Patient describing persistent dizziness described as room spinning but definitely triggered by movement of the head to the left or moving the eyes to the left.  Has cerumen impaction, has a recent upper respiratory infection and so could be dealing with viral vestibular neuritis.  No other neurological symptoms or deficits to suggest central vertigo, no indication for CNS imaging.  Appropriate for discharge with symptomatic management.  Procedures  Final Clinical Impressions(s) / ED Diagnoses     ICD-10-CM   1. Vertigo  R42     2. Diverticulitis  K57.92     3. Bilateral impacted cerumen  [id number]       ED Discharge Orders          Ordered    amoxicillin -clavulanate (AUGMENTIN ) 875-125 MG tablet  Every 12 hours        09/04/23 0138    meclizine  (ANTIVERT ) 25 MG tablet  3 times daily PRN        09/04/23 0138    carbamide peroxide (DEBROX) 6.5 % OTIC solution  2 times daily        09/04/23 0138              Discharge Instructions      You were evaluated in the Emergency Department and after careful evaluation, we did not find any emergent condition requiring admission or further testing in the hospital.  Your exam/testing today is overall reassuring.  We found evidence of diverticulitis on your CT scan.  Stop taking your amoxicillin  and start taking the Augmentin  medication as prescribed.  We suspect your dizziness is related to peripheral vertigo.  Recommend using the Debrox drops for your ears, recommend use of the meclizine  medication for dizziness as needed, recommend follow-up  with your primary care doctors to discuss her symptoms.  Please return to the Emergency Department if you experience any worsening of your condition.   Thank you for allowing us  to be a part of your care.      Ozell HERO. Theadore, MD [facility] Emergency Medicine [facility] [email or web address]    Theadore Ozell HERO, MD 09/04/23 [phone]

## 2023-09-03 NOTE — ED Provider Notes (Signed)
 [place] EMERGENCY DEPARTMENT AT [facility] Provider Note   CSN: [id number] Arrival date & time: 09/03/23  2048     History  No chief complaint on file.   EARSIE HUMM is a 78 y.o. female.  The history is provided by the patient, a relative and medical records. No language interpreter was used.     77-year female presenting with multiple complaints.  History obtained through patient and through daughter who is at bedside.  4 days ago patient developed sore throat and left ear pain.  She was seen by her PCP for evaluation and states she had a strep test and COVID and flu test done which was negative.  However she was started on amoxicillin  for suspected infection.  For the past 2 days she endorsed increasing nausea, dizziness and described more as like an unsteady sensation not room spinning sensation, decrease in appetite, and now having some abdominal pain.  No fever or chills no headache no hearing changes no vision changes no cough or shortness of breath and no urinary discomfort.  Home Medications Prior to Admission medications   Medication Sig Start Date End Date Taking? Authorizing Provider  amoxicillin  (AMOXIL ) 500 MG capsule Take 1 capsule (500 mg total) by mouth 2 (two) times daily for 10 days. 08/31/23 09/10/23  Lavell Lye A, FNP  aspirin EC 81 MG tablet Take 81 mg by mouth daily.    [provider]  atorvastatin  (LIPITOR) 80 MG tablet Take 1 tablet (80 mg total) by mouth at bedtime. 07/13/23   Severa Rock HERO, FNP  Cholecalciferol 50 MCG (2000 UT) CAPS Take by mouth.    [provider]  escitalopram  (LEXAPRO ) 10 MG tablet Take 1 tablet (10 mg total) by mouth daily. 05/18/23   Severa Rock HERO, FNP  folic acid (FOLVITE) 1 MG tablet Take by mouth.    [provider]  ibuprofen (ADVIL) 200 MG tablet Take by mouth.    [provider]  levocetirizine (XYZAL ) 5 MG tablet Take 1 tablet (5 mg total) by mouth every evening. 10/12/22    Rakes, Rock HERO, FNP  lisinopril  (ZESTRIL ) 10 MG tablet Take 1 tablet (10 mg total) by mouth daily. 07/13/23   Severa Rock HERO, FNP  metoprolol  succinate (TOPROL -XL) 50 MG 24 hr tablet Take 1 tablet (50 mg total) by mouth daily. 07/13/23   Severa Rock HERO, FNP  omega-3 fish oil (MAXEPA) 1000 MG CAPS capsule Take 1 capsule by mouth daily.    [provider]  omeprazole (PRILOSEC) 20 MG capsule Take 20 mg by mouth daily.    [provider]  ondansetron  (ZOFRAN ) 4 MG tablet Take 1 tablet (4 mg total) by mouth every 8 (eight) hours as needed for nausea or vomiting. 08/31/23   Lavell Lye A, FNP  Semaglutide , 1 MG/DOSE, 4 MG/3ML SOPN Inject 1 mg as directed once a week. 07/13/23   Severa Rock HERO, FNP      Allergies    Patient has no known allergies.    Review of Systems   Review of Systems  All other systems reviewed and are negative.   Physical Exam Updated Vital Signs BP 128/83   Pulse 85   Temp 97.8 F (36.6 C) (Oral)   Resp 16   SpO2 92%  Physical Exam Vitals and nursing note reviewed.  Constitutional:      General: She is not in acute distress.    Appearance: She is well-developed.  HENT:  Head: Atraumatic.     Ears:     Comments: Cerumen impaction involving bilateral ear canal, unable to visualize TMs    Mouth/Throat:     Comments: Left post oral pharyngeal erythema noted.  Uvula midline Eyes:     Extraocular Movements: Extraocular movements intact.     Conjunctiva/sclera: Conjunctivae normal.     Pupils: Pupils are equal, round, and reactive to light.     Comments: Right horizontal fatigable nystagmus  Cardiovascular:     Rate and Rhythm: Normal rate and regular rhythm.  Pulmonary:     Effort: Pulmonary effort is normal.  Abdominal:     Palpations: Abdomen is soft.     Tenderness: There is abdominal tenderness (Mild nonlocalized abdominal tenderness).  Musculoskeletal:     Cervical back: Normal range of motion and neck supple.     Comments: 5 out  of 5 strength to all 4 extremities  Skin:    Findings: No rash.  Neurological:     Mental Status: She is alert and oriented to person, place, and time.  Psychiatric:        Mood and Affect: Mood normal.     ED Results / Procedures / Treatments   Labs (all labs ordered are listed, but only abnormal results are displayed) Labs Reviewed  COMPREHENSIVE METABOLIC PANEL - Abnormal; Notable for the following components:      Result Value   Sodium 132 (*)    Chloride 94 (*)    Glucose, Bld 186 (*)    Creatinine, Ser 1.15 (*)    GFR, Estimated 49 (*)    All other components within normal limits  CBG MONITORING, ED - Abnormal; Notable for the following components:   Glucose-Capillary 177 (*)    All other components within normal limits  RESP PANEL BY RT-PCR (RSV, FLU A&B, COVID)  [id number]  CBC WITH DIFFERENTIAL/PLATELET  URINALYSIS, ROUTINE W REFLEX MICROSCOPIC    EKG None ED ECG REPORT   Date: 09/03/2023  Rate: 88  Rhythm: normal sinus rhythm  QRS Axis: normal  Intervals: normal  ST/T Wave abnormalities: normal  Conduction Disutrbances:none  Narrative Interpretation:   Old EKG Reviewed: unchanged  I have personally reviewed the EKG tracing and agree with the computerized printout as noted.   Radiology No results found.  Procedures Procedures    Medications Ordered in ED Medications  ondansetron  (ZOFRAN ) injection 4 mg (4 mg Intravenous Given 09/03/23 2234)  meclizine  (ANTIVERT ) tablet 25 mg (25 mg Oral Given 09/03/23 2235)  sodium chloride  0.9 % bolus 1,000 mL (1,000 mLs Intravenous New Bag/Given 09/03/23 2234)    ED Course/ Medical Decision Making/ A&P                                 Medical Decision Making Amount and/or Complexity of Data Reviewed Labs: ordered.   BP 135/73   Pulse 85   Temp 97.8 F (36.6 C) (Oral)   Resp 16   SpO2 92%   10:15 PM   77-year female presenting with multiple complaints.  History obtained through patient and through daughter  who is at bedside.  4 days ago patient developed sore throat and left ear pain.  She was seen by her PCP for evaluation and states she had a strep test and COVID and flu test done which was negative.  However she was started on amoxicillin  for suspected infection.  For the past 2 days she endorsed  increasing nausea, dizziness and described more as like an unsteady sensation not room spinning sensation, decrease in appetite, and now having some abdominal pain.  No fever or chills no headache no hearing changes no vision changes no cough or shortness of breath and no urinary discomfort.  On exam patient has cerumen impaction involving both ear which may contribute to her dizziness.  She also has some fatigable horizontal nystagmus.  She has some mild nonlocalized abdominal tenderness on palpation.  Bowel sounds are present.  She is without focal neurodeficit.  -Labs ordered, independently viewed and interpreted by me.  Labs remarkable for Na+ 132, CBG 186 -The patient was maintained on a cardiac monitor.  I personally viewed and interpreted the cardiac monitored which showed an underlying rhythm of: NSR -Imaging including abd/pelvis CT currently pending -This patient presents to the ED for concern of dizzy, this involves an extensive number of treatment options, and is a complaint that carries with it a high risk of complications and morbidity.  The differential diagnosis includes peripheral vertigo, anemia, electrolytes derangement, infection, stroke, hypovolemia, cardiac arrhythmia -Co morbidities that complicate the patient evaluation includes DM, CAD, CKD, HLD, HTN -Treatment includes meclizine , zofran , IVF -Reevaluation of the patient after these medicines showed that the patient improved -PCP office notes or outside notes reviewed -Discussion with attending Dr. Theadore who will f/u on CT result and will reassess -Escalation to admission/observation considered: dispo pending         Final  Clinical Impression(s) / ED Diagnoses Final diagnoses:  None    Rx / DC Orders ED Discharge Orders     None         Nivia Colon, PA-C 09/03/23 2303    Elnor Savant A, DO 09/05/23 0800

## 2023-09-04 MED ORDER — AMOXICILLIN-POT CLAVULANATE 875-125 MG PO TABS: 1.0000 | ORAL_TABLET | Freq: Two times a day (BID) | ORAL | 0 refills | Status: AC

## 2023-09-04 MED ORDER — MECLIZINE HCL 25 MG PO TABS: 25.0000 mg | ORAL_TABLET | Freq: Three times a day (TID) | ORAL | 0 refills | Status: DC | PRN

## 2023-09-04 MED ORDER — CARBAMIDE PEROXIDE 6.5 % OT SOLN: 5.0000 [drp] | Freq: Two times a day (BID) | OTIC | 0 refills | Status: DC

## 2023-09-04 NOTE — Discharge Instructions (Signed)
 You were evaluated in the Emergency Department and after careful evaluation, we did not find any emergent condition requiring admission or further testing in the hospital.  Your exam/testing today is overall reassuring.  We found evidence of diverticulitis on your CT scan.  Stop taking your amoxicillin  and start taking the Augmentin  medication as prescribed.  We suspect your dizziness is related to peripheral vertigo.  Recommend using the Debrox drops for your ears, recommend use of the meclizine  medication for dizziness as needed, recommend follow-up with your primary care doctors to discuss her symptoms.  Please return to the Emergency Department if you experience any worsening of your condition.   Thank you for allowing us  to be a part of your care.

## 2023-09-11 ENCOUNTER — Encounter: Payer: Self-pay | Admitting: Family Medicine

## 2023-09-12 ENCOUNTER — Ambulatory Visit: Payer: Self-pay | Admitting: Family Medicine

## 2023-09-12 NOTE — Telephone Encounter (Signed)
 Chief Complaint: dizziness  Symptoms: nausea/vomiting Frequency: when moving  P Disposition: [] ED /[] Urgent Care (no appt availability in office) / [x] Appointment(In office/virtual)/ []  [facility]/ [] Home Care/ [] Refused Recommended Disposition /[]  Mobile Bus/ []  Follow-up with PCP Additional Notes: Daughter Marijean Niemann calling on behalf of pt, who was on speaker phone, with complaints of worsening dizziness. Pt was seen in ED with vertigo on 2/9. She was given Meclizine and zofran and per Marijean Niemann "it knocks her out". Any movement of body or eyes causes n/v. Pt feels very weak due to n/v. Pt is able to eat and drink but once moving, she vomits. Pt is also complaining of burning sensation on left side of face starting at scalp and goes to shoulder. Daughter noted there is a blister below left ear. Burning is worse at night, but pt is putting aloe vera on and gets some relief.  Marijean Niemann spoke with her provider yesterday and thinking this could be shingles. Per protocol, pt to be seen within 24 hours. N appt for today can be scheduled. Pt has appt tomorrow at 0850 virtually. If pt can be worked into today with Rakes or Nadine Counts, the pt would be very Adult nurse. RN gave care advice and daughter understood.                  Copied from CRM [id number] [id number]. Topic: Clinical - Red Word Triage >> Sep 12, 2023  8:51 AM Dennison Nancy wrote: Red Word that prompted transfer to Nurse Triage:  Hedy Jacob (Daughter) dizzyness , pain on left side of face kinda feeling like that side is burning  , and have some blister under ear, thinking may have shingles Reason for Disposition  [1] MODERATE dizziness (e.g., interferes with normal activities) AND [2] has NOT been evaluated by doctor (or NP/PA) for this  (Exception: Dizziness caused by heat exposure, sudden standing, or poor fluid intake.)  Answer Assessment - Initial Assessment Questions 1. DESCRIPTION: "Describe your dizziness."     Can't  move head/head without getting dizzy  2. LIGHTHEADED: "Do you feel lightheaded?" (e.g., somewhat faint, woozy, weak upon standing)     no 3. VERTIGO: "Do you feel like either you or the room is spinning or tilting?" (i.e. vertigo)     no 4. SEVERITY: "How bad is it?"  "Do you feel like you are going to faint?" "Can you stand and walk?"   - MILD: Feels slightly dizzy, but walking normally.   - MODERATE: Feels unsteady when walking, but not falling; interferes with normal activities (e.g., school, work).   - SEVERE: Unable to walk without falling, or requires assistance to walk without falling; feels like passing out now.      Severe  5. ONSET:  "When did the dizziness begin?"     2/7 6. AGGRAVATING FACTORS: "Does anything make it worse?" (e.g., standing, change in head position)     Any movement  7. HEART RATE: "Can you tell me your heart rate?" "How many beats in 15 seconds?"  (Note: not all patients can do this)       No issues  8. CAUSE: "What do you think is causing the dizziness?"     Unsure  9. RECURRENT SYMPTOM: "Have you had dizziness before?" If Yes, ask: "When was the last time?" "What happened that time?"     no 10. OTHER SYMPTOMS: "Do you have any other symptoms?" (e.g., fever, chest pain, vomiting, diarrhea, bleeding)       Nausea /vomiting/ weak  Protocols used: Dizziness - Lightheadedness-A-AH

## 2023-09-13 ENCOUNTER — Telehealth (INDEPENDENT_AMBULATORY_CARE_PROVIDER_SITE_OTHER): Payer: Medicare HMO

## 2023-09-13 ENCOUNTER — Encounter: Payer: Self-pay | Admitting: Family Medicine

## 2023-09-13 DIAGNOSIS — M792 Neuralgia and neuritis, unspecified: Secondary | ICD-10-CM | POA: Diagnosis not present

## 2023-09-13 DIAGNOSIS — R21 Rash and other nonspecific skin eruption: Secondary | ICD-10-CM | POA: Diagnosis not present

## 2023-09-13 DIAGNOSIS — E1169 Type 2 diabetes mellitus with other specified complication: Secondary | ICD-10-CM | POA: Diagnosis not present

## 2023-09-13 DIAGNOSIS — R42 Dizziness and giddiness: Secondary | ICD-10-CM | POA: Diagnosis not present

## 2023-09-13 MED ORDER — VALACYCLOVIR HCL 1 G PO TABS: 1000.0000 mg | ORAL_TABLET | Freq: Three times a day (TID) | ORAL | 0 refills | Status: AC

## 2023-09-13 MED ORDER — PREDNISONE 20 MG PO TABS: 20.0000 mg | ORAL_TABLET | Freq: Every day | ORAL | 0 refills | Status: AC

## 2023-09-13 MED ORDER — GABAPENTIN 100 MG PO CAPS: 100.0000 mg | ORAL_CAPSULE | Freq: Three times a day (TID) | ORAL | 0 refills | Status: DC

## 2023-09-13 NOTE — Progress Notes (Signed)
 Virtual Visit via video Note   Due to COVID-19 pandemic this visit was conducted virtually. This visit type was conducted due to national recommendations for restrictions regarding the COVID-19 Pandemic (e.g. social distancing, sheltering in place) in an effort to limit this patient's exposure and mitigate transmission in our community. All issues noted in this document were discussed and addressed.  A physical exam was not performed with this format.  I connected with  Bishop Limbo  on 09/13/23 at 1239 by video and verified that I am speaking with the correct person using two identifiers. Kristina Peterson is currently located at home and her daughter is currently with her during the visit. The provider, Gabriel Earing, FNP is located in their office at time of visit.  I discussed the limitations, risks, security and privacy concerns of performing an evaluation and management service by video  and the availability of in person appointments. I also discussed with the patient that there may be a patient responsible charge related to this service. The patient expressed understanding and agreed to proceed.  CC: dizziness  History and Present Illness:  Kristina Peterson has had dizziness since 09/01/23. Feels like an unsteadiness. Unchanged. Triggered by movement, standing. Does not occur if she is still. She has had intermittent nausea and occasional vomiitng when she is up moving around. Her daughter and family helps her with ambulation to ensure she doesn't fall. She has been moving slowly with some help as well. She also reports tenderness and burning pain to the left side of her head, ear, left neck and shoulder. This has been unchanged. Her granddaughter was irigatig her ear and noticed a blister like lesion behind her left ear. They are concerned that she may have shingles. She has been evaluated in the ER on 09/03/23. She has been treated with meclizine, antibiotics for diverticulitis, and zofran. She denies  changes in vision, focal weakness, numbness, tingling, fever, confusion, changes in speech.    ROS As per HPI.     Observations/Objective: Alert and oriented. Respirations unlabored. No cyanosis. Non toxic appearing. Normal mood and behavior. No facial asymmetry. Normal speech. Unable to visualize lesion on video.   Assessment and Plan: Kristina "Kristina Peterson" was seen today for dizziness.  Diagnoses and all orders for this visit:  Dizziness -     predniSONE (DELTASONE) 20 MG tablet; Take 1 tablet (20 mg total) by mouth daily with breakfast for 5 days.  Rash -     valACYclovir (VALTREX) 1000 MG tablet; Take 1 tablet (1,000 mg total) by mouth 3 (three) times daily for 7 days.  Nerve pain -     gabapentin (NEURONTIN) 100 MG capsule; Take 1 capsule (100 mg total) by mouth 3 (three) times daily.  Type 2 diabetes mellitus with other specified complication, without long-term current use of insulin (HCC)   ? Shingles. Unable to visualize lesion on camera. Will treat with valtrex given tenderness and burning pain. Reviewed ER note, imaging, labs. Prednisone as above for possible vestibular labyrinthitis. Discussed that prednisone by increase blood sugars. Last A1c 6.5. Can try low dose gabapentin for nerve pain- aware of possible side effects.  Follow Up Instructions: Return to office for new or worsening symptoms, or if symptoms persist.     I discussed the assessment and treatment plan with the patient. The patient was provided an opportunity to ask questions and all were answered. The patient agreed with the plan and demonstrated an understanding of the instructions.   The  patient was advised to call back or seek an in-person evaluation if the symptoms worsen or if the condition fails to improve as anticipated.  The above assessment and management plan was discussed with the patient. The patient verbalized understanding of and has agreed to the management plan. Patient is aware to call the  clinic if symptoms persist or worsen. Patient is aware when to return to the clinic for a follow-up visit. Patient educated on when it is appropriate to go to the emergency department.   Time call ended: 0851  I provided 12 minutes of face-to-face time during this encounter.    Gabriel Earing, FNP

## 2023-09-15 ENCOUNTER — Telehealth: Payer: Medicare HMO | Admitting: Family Medicine

## 2023-09-15 DIAGNOSIS — B370 Candidal stomatitis: Secondary | ICD-10-CM

## 2023-09-15 MED ORDER — NYSTATIN 100000 UNIT/ML MT SUSP: 5.0000 mL | Freq: Four times a day (QID) | OROMUCOSAL | 0 refills | Status: AC

## 2023-09-15 MED ORDER — FLUCONAZOLE 150 MG PO TABS: 150.0000 mg | ORAL_TABLET | Freq: Every day | ORAL | 0 refills | Status: AC

## 2023-09-15 NOTE — Patient Instructions (Signed)

## 2023-09-15 NOTE — Progress Notes (Signed)
 Virtual Visit Consent   Kristina Peterson, you are scheduled for a virtual visit with a [facility] provider today. Just as with appointments in the office, your consent must be obtained to participate. Your consent will be active for this visit and any virtual visit you may have with one of our providers in the next 365 days. If you have a MyChart account, a copy of this consent can be sent to you electronically.  As this is a virtual visit, video technology does not allow for your provider to perform a traditional examination. This may limit your provider's ability to fully assess your condition. If your provider identifies any concerns that need to be evaluated in person or the need to arrange testing (such as labs, EKG, etc.), we will make arrangements to do so. Although advances in technology are sophisticated, we cannot ensure that it will always work on either your end or our end. If the connection with a video visit is poor, the visit may have to be switched to a telephone visit. With either a video or telephone visit, we are not always able to ensure that we have a secure connection.  By engaging in this virtual visit, you consent to the provision of healthcare and authorize for your insurance to be billed (if applicable) for the services provided during this visit. Depending on your insurance coverage, you may receive a charge related to this service.  I need to obtain your verbal consent now. Are you willing to proceed with your visit today? ANYSA TACEY has provided verbal consent on 09/15/2023 for a virtual visit (video or telephone). Georgana Curio, FNP  Date: 09/15/2023 6:35 PM   Virtual Visit via Video Note   I, Georgana Curio, connected with  Kristina Peterson  ([id number], 22-Oct-1945) on 09/15/23 at  6:30 PM EST by a video-enabled telemedicine application and verified that I am speaking with the correct person using two identifiers.  Location: Patient: Virtual Visit Location Patient:  Home Provider: Virtual Visit Location Provider: Home Office   I discussed the limitations of evaluation and management by telemedicine and the availability of in person appointments. The patient expressed understanding and agreed to proceed.    History of Present Illness: Kristina Peterson is a 78 y.o. who identifies as a female who was assigned female at birth, and is being seen today for thrush on tongue and in cheeks from diabetes, recently had shingles, and was on antibiotics. Daughter is with her. Marland Kitchen  HPI: HPI  Problems:  Patient Active Problem List   Diagnosis Date Noted   Incisional hernia, without obstruction or gangrene 07/13/2023   CKD stage 4 due to type 2 diabetes mellitus (HCC) 01/18/2023   Depression, major, single episode, mild (HCC) 01/18/2023   Allergic rhinitis 10/12/2022   Diabetes mellitus (HCC) 07/22/2021   Hyperlipidemia associated with type 2 diabetes mellitus (HCC) 07/22/2021   Hypertension associated with diabetes (HCC) 07/22/2021   History of endometrial cancer 07/22/2021   Patient is Jehovah's Witness 07/22/2021   Morbid obesity (HCC) 07/22/2021   Vitamin D deficiency 07/22/2021   CAD (coronary artery disease) 04/29/2020   Refusal of blood transfusions as patient is Jehovah's Witness 04/28/2020   Pancolonic diverticulosis 06/11/2018   Degenerative joint disease 03/21/2007    Allergies: No Known Allergies Medications:  Current Outpatient Medications:    fluconazole (DIFLUCAN) 150 MG tablet, Take 1 tablet (150 mg total) by mouth daily for 1 day., Disp: 1 tablet, Rfl: 0   nystatin (MYCOSTATIN)  100000 UNIT/ML suspension, Take 5 mLs (500,000 Units total) by mouth 4 (four) times daily for 10 days., Disp: 200 mL, Rfl: 0   aspirin EC 81 MG tablet, Take 81 mg by mouth daily., Disp: , Rfl:    atorvastatin (LIPITOR) 80 MG tablet, Take 1 tablet (80 mg total) by mouth at bedtime., Disp: 90 tablet, Rfl: 1   carbamide peroxide (DEBROX) 6.5 % OTIC solution, Place 5 drops into  both ears 2 (two) times daily., Disp: 15 mL, Rfl: 0   Cholecalciferol 50 MCG (2000 UT) CAPS, Take by mouth., Disp: , Rfl:    escitalopram (LEXAPRO) 10 MG tablet, Take 1 tablet (10 mg total) by mouth daily., Disp: 90 tablet, Rfl: 5   folic acid (FOLVITE) 1 MG tablet, Take by mouth., Disp: , Rfl:    gabapentin (NEURONTIN) 100 MG capsule, Take 1 capsule (100 mg total) by mouth 3 (three) times daily., Disp: 90 capsule, Rfl: 0   ibuprofen (ADVIL) 200 MG tablet, Take by mouth., Disp: , Rfl:    levocetirizine (XYZAL) 5 MG tablet, Take 1 tablet (5 mg total) by mouth every evening., Disp: 90 tablet, Rfl: 1   lisinopril (ZESTRIL) 10 MG tablet, Take 1 tablet (10 mg total) by mouth daily., Disp: 90 tablet, Rfl: 1   meclizine (ANTIVERT) 25 MG tablet, Take 1 tablet (25 mg total) by mouth 3 (three) times daily as needed for dizziness., Disp: 30 tablet, Rfl: 0   metoprolol succinate (TOPROL-XL) 50 MG 24 hr tablet, Take 1 tablet (50 mg total) by mouth daily., Disp: 90 tablet, Rfl: 1   omega-3 fish oil (MAXEPA) 1000 MG CAPS capsule, Take 1 capsule by mouth daily., Disp: , Rfl:    omeprazole (PRILOSEC) 20 MG capsule, Take 20 mg by mouth daily., Disp: , Rfl:    ondansetron (ZOFRAN) 4 MG tablet, Take 1 tablet (4 mg total) by mouth every 8 (eight) hours as needed for nausea or vomiting., Disp: 20 tablet, Rfl: 0   predniSONE (DELTASONE) 20 MG tablet, Take 1 tablet (20 mg total) by mouth daily with breakfast for 5 days., Disp: 5 tablet, Rfl: 0   Semaglutide, 1 MG/DOSE, 4 MG/3ML SOPN, Inject 1 mg as directed once a week., Disp: 3 mL, Rfl: 3   valACYclovir (VALTREX) 1000 MG tablet, Take 1 tablet (1,000 mg total) by mouth 3 (three) times daily for 7 days., Disp: 21 tablet, Rfl: 0  Observations/Objective: Patient is well-developed, well-nourished in no acute distress.  Resting comfortably  at home.  Head is normocephalic, atraumatic.  No labored breathing.  Speech is clear and coherent with logical content.  Patient is  alert and oriented at baseline.  White patches visible on tongue and in cheeks.   Assessment and Plan: 1. Thrush (Primary)  UC as needed.   Follow Up Instructions: I discussed the assessment and treatment plan with the patient. The patient was provided an opportunity to ask questions and all were answered. The patient agreed with the plan and demonstrated an understanding of the instructions.  A copy of instructions were sent to the patient via MyChart unless otherwise noted below.     The patient was advised to call back or seek an in-person evaluation if the symptoms worsen or if the condition fails to improve as anticipated.    Georgana Curio, FNP g

## 2023-09-21 ENCOUNTER — Ambulatory Visit (INDEPENDENT_AMBULATORY_CARE_PROVIDER_SITE_OTHER): Payer: Medicare HMO | Admitting: Family Medicine

## 2023-09-21 ENCOUNTER — Encounter: Payer: Self-pay | Admitting: Family Medicine

## 2023-09-21 VITALS — BP 138/86 | HR 73 | Temp 97.1°F | Ht 65.0 in | Wt 211.2 lb

## 2023-09-21 DIAGNOSIS — R42 Dizziness and giddiness: Secondary | ICD-10-CM

## 2023-09-21 DIAGNOSIS — B0221 Postherpetic geniculate ganglionitis: Secondary | ICD-10-CM | POA: Diagnosis not present

## 2023-09-21 MED ORDER — VALACYCLOVIR HCL 1 G PO TABS: 1000.0000 mg | ORAL_TABLET | Freq: Three times a day (TID) | ORAL | 0 refills | Status: AC

## 2023-09-21 MED ORDER — PREDNISONE 20 MG PO TABS: 40.0000 mg | ORAL_TABLET | Freq: Every day | ORAL | 0 refills | Status: AC

## 2023-09-21 NOTE — Progress Notes (Signed)
 Order as part of the work up

## 2023-09-21 NOTE — Progress Notes (Signed)
 Subjective:  Patient ID: Kristina Peterson, female    DOB: 06-08-1946, 78 y.o.   MRN: [id number]  Patient Care Team: Sonny Masters, FNP as PCP - General (Family Medicine) [facility]   Chief Complaint:  Herpes Zoster (Left ear- patient would like it re checked )   HPI: Kristina Peterson is a 78 y.o. female presenting on 09/21/2023 for Herpes Zoster (Left ear- patient would like it re checked )   Discussed the use of AI scribe software for clinical note transcription with the patient, who gave verbal consent to proceed.  History of Present Illness   Kristina SHANE "Scarlette Calico" is a 78 year old female who presents with shingles in the ear.  She initially experienced tenderness on the left side of her head, followed by a sore throat and ear pain. Swelling at the back of her tongue prompted her to seek medical attention. Initial tests for flu and strep throat were negative, but she was treated with amoxicillin for suspected strep throat, which did not alleviate her symptoms.  Her condition worsened with balance issues, nausea, and vomiting, leading to an emergency room visit where her medication was changed to Augmentin. She was advised to use Debrox drops for ear wax buildup, which her granddaughter, a nurse, later helped to clear. During this process, her granddaughter noticed trauma in the ear and black spots, which were removed.  She experienced severe ear pain and burning sensations on the side of her head, initially thought to be a burn from a heating pad. Her daughter suggested the possibility of shingles, which was later confirmed during a virtual visit. She was prescribed valacyclovir, gabapentin, and prednisone for treatment.  She reports persistent dizziness and instability, particularly when standing or walking, which has been ongoing for about a month. She completed a 10-day course of valacyclovir and notes that her vertigo is less pronounced when sitting or lying down. She  continues to experience throbbing ear pain and some facial droop on the left side. No hearing loss, but acknowledges the facial droop and ongoing dizziness. She is able to eat and drink, though somewhat limited. She has been taking valacyclovir three times a day and prednisone as prescribed.          Relevant past medical, surgical, family, and social history reviewed and updated as indicated.  Allergies and medications reviewed and updated. Data reviewed: Chart in Epic.   Past Medical History:  Diagnosis Date   Diabetes mellitus without complication (HCC)    Hypertension     Past Surgical History:  Procedure Laterality Date   DILATION AND CURETTAGE OF UTERUS     HYSTEROSCOPY WITH D & C N/A 02/13/2015   Procedure: HYSTEROSCOPY, UTERINE CURETTAGE;  Surgeon: Lazaro Arms, MD;  Location: [facility];  Service: Gynecology;  Laterality: N/A;    Social History   Socioeconomic History   Marital status: Married    Spouse name: Not on file   Number of children: Not on file   Years of education: Not on file   Highest education level: Not on file  Occupational History   Not on file  Tobacco Use   Smoking status: Never   Smokeless tobacco: Never  Vaping Use   Vaping status: Never Used  Substance and Sexual Activity   Alcohol use: No    Alcohol/week: 0.0 standard drinks of alcohol   Drug use: No   Sexual activity: Not Currently  Other Topics Concern  Not on file  Social History Narrative   Not on file   Social Drivers of Health   Financial Resource Strain: Low Risk  (12/13/2022)   Overall Financial Resource Strain (CARDIA)    Difficulty of Paying Living Expenses: Not hard at all  Food Insecurity: No Food Insecurity (12/13/2022)   Hunger Vital Sign    Worried About Running Out of Food in the Last Year: Never true    Ran Out of Food in the Last Year: Never true  Transportation Needs: No Transportation Needs (12/13/2022)   PRAPARE - Administrator, Civil Service  (Medical): No    Lack of Transportation (Non-Medical): No  Physical Activity: Insufficiently Active (12/13/2022)   Exercise Vital Sign    Days of Exercise per Week: 1 day    Minutes of Exercise per Session: 10 min  Stress: No Stress Concern Present (12/13/2022)   [employer] of Occupational Health - Occupational Stress Questionnaire    Feeling of Stress : Only a little  Social Connections: Moderately Integrated (12/13/2022)   Social Connection and Isolation Panel [NHANES]    Frequency of Communication with Friends and Family: More than three times a week    Frequency of Social Gatherings with Friends and Family: More than three times a week    Attends Religious Services: 1 to 4 times per year    Active Member of [employer] or Organizations: No    Attends Banker Meetings: Never    Marital Status: Married  Catering manager Violence: Not At Risk (12/13/2022)   Humiliation, Afraid, Rape, and Kick questionnaire    Fear of Current or Ex-Partner: No    Emotionally Abused: No    Physically Abused: No    Sexually Abused: No    Outpatient Encounter Medications as of 09/21/2023  Medication Sig   aspirin EC 81 MG tablet Take 81 mg by mouth daily.   atorvastatin (LIPITOR) 80 MG tablet Take 1 tablet (80 mg total) by mouth at bedtime.   carbamide peroxide (DEBROX) 6.5 % OTIC solution Place 5 drops into both ears 2 (two) times daily.   Cholecalciferol 50 MCG (2000 UT) CAPS Take by mouth.   escitalopram (LEXAPRO) 10 MG tablet Take 1 tablet (10 mg total) by mouth daily.   folic acid (FOLVITE) 1 MG tablet Take by mouth.   gabapentin (NEURONTIN) 100 MG capsule Take 1 capsule (100 mg total) by mouth 3 (three) times daily.   ibuprofen (ADVIL) 200 MG tablet Take by mouth.   levocetirizine (XYZAL) 5 MG tablet Take 1 tablet (5 mg total) by mouth every evening.   lisinopril (ZESTRIL) 10 MG tablet Take 1 tablet (10 mg total) by mouth daily.   meclizine (ANTIVERT) 25 MG tablet Take 1 tablet (25 mg  total) by mouth 3 (three) times daily as needed for dizziness.   metoprolol succinate (TOPROL-XL) 50 MG 24 hr tablet Take 1 tablet (50 mg total) by mouth daily.   nystatin (MYCOSTATIN) 100000 UNIT/ML suspension Take 5 mLs (500,000 Units total) by mouth 4 (four) times daily for 10 days.   omega-3 fish oil (MAXEPA) 1000 MG CAPS capsule Take 1 capsule by mouth daily.   omeprazole (PRILOSEC) 20 MG capsule Take 20 mg by mouth daily.   ondansetron (ZOFRAN) 4 MG tablet Take 1 tablet (4 mg total) by mouth every 8 (eight) hours as needed for nausea or vomiting.   predniSONE (DELTASONE) 20 MG tablet Take 2 tablets (40 mg total) by mouth daily with breakfast for  5 days.   Semaglutide, 1 MG/DOSE, 4 MG/3ML SOPN Inject 1 mg as directed once a week.   valACYclovir (VALTREX) 1000 MG tablet Take 1 tablet (1,000 mg total) by mouth 3 (three) times daily for 7 days.   No facility-administered encounter medications on file as of 09/21/2023.    No Known Allergies  Pertinent ROS per HPI, otherwise unremarkable      Objective:  BP 138/86   Pulse 73   Temp (!) 97.1 F (36.2 C)   Ht 5\' 5"  (1.651 m)   Wt 211 lb 3.2 oz (95.8 kg)   SpO2 93%   BMI 35.15 kg/m    Wt Readings from Last 3 Encounters:  09/21/23 211 lb 3.2 oz (95.8 kg)  08/31/23 213 lb 3.2 oz (96.7 kg)  07/13/23 214 lb 9.6 oz (97.3 kg)    Physical Exam Vitals and nursing note reviewed.  Constitutional:      General: She is not in acute distress.    Appearance: Normal appearance. She is obese. She is ill-appearing. She is not toxic-appearing or diaphoretic.  HENT:     Head: Normocephalic and atraumatic.     Right Ear: Tympanic membrane, ear canal and external ear normal.     Left Ear: Tenderness present.     Ears:     Comments: Left canal with vesicular lesions, some with dried blood, 2 lesions on TM    Nose: Nose normal.     Mouth/Throat:     Mouth: Mucous membranes are moist.     Pharynx: Oropharynx is clear.  Eyes:      Conjunctiva/sclera: Conjunctivae normal.     Pupils: Pupils are equal, round, and reactive to light.  Cardiovascular:     Rate and Rhythm: Normal rate and regular rhythm.     Heart sounds: Normal heart sounds.  Pulmonary:     Effort: Pulmonary effort is normal.     Breath sounds: Normal breath sounds.  Musculoskeletal:     Cervical back: Normal range of motion and neck supple.  Skin:    General: Skin is warm and dry.     Capillary Refill: Capillary refill takes less than 2 seconds.  Neurological:     Mental Status: She is alert and oriented to person, place, and time.     Cranial Nerves: Facial asymmetry (left lower face droop, able to close left eye completely) present.  Psychiatric:        Mood and Affect: Mood normal.        Behavior: Behavior normal.        Thought Content: Thought content normal.        Judgment: Judgment normal.    Physical Exam   HEENT: Lesions in ear canal and on tympanic membrane. NEUROLOGICAL: Facial droop on left side.        Results for orders placed or performed during the hospital encounter of 09/03/23  CBC with Differential   Collection Time: 09/03/23  9:51 PM  Result Value Ref Range   WBC 7.6 4.0 - 10.5 K/uL   RBC 4.46 3.87 - 5.11 MIL/uL   Hemoglobin 13.5 12.0 - 15.0 g/dL   HCT 47.8 29.5 - 62.1 %   MCV 87.4 80.0 - 100.0 fL   MCH 30.3 26.0 - 34.0 pg   MCHC 34.6 30.0 - 36.0 g/dL   RDW 30.8 65.7 - 84.6 %   Platelets 189 150 - 400 K/uL   nRBC 0.0 0.0 - 0.2 %   Neutrophils Relative % 82 %  Neutro Abs 6.2 1.7 - 7.7 K/uL   Lymphocytes Relative 13 %   Lymphs Abs 1.0 0.7 - 4.0 K/uL   Monocytes Relative 4 %   Monocytes Absolute 0.3 0.1 - 1.0 K/uL   Eosinophils Relative 0 %   Eosinophils Absolute 0.0 0.0 - 0.5 K/uL   Basophils Relative 1 %   Basophils Absolute 0.0 0.0 - 0.1 K/uL   Immature Granulocytes 0 %   Abs Immature Granulocytes 0.02 0.00 - 0.07 K/uL  Comprehensive metabolic panel   Collection Time: 09/03/23  9:51 PM  Result Value  Ref Range   Sodium 132 (L) 135 - 145 mmol/L   Potassium 4.3 3.5 - 5.1 mmol/L   Chloride 94 (L) 98 - 111 mmol/L   CO2 27 22 - 32 mmol/L   Glucose, Bld 186 (H) 70 - 99 mg/dL   BUN 19 8 - 23 mg/dL   Creatinine, Ser 4.09 (H) 0.44 - 1.00 mg/dL   Calcium 9.6 8.9 - 81.1 mg/dL   Total Protein 7.5 6.5 - 8.1 g/dL   Albumin 3.9 3.5 - 5.0 g/dL   AST 19 15 - 41 U/L   ALT 20 0 - 44 U/L   Alkaline Phosphatase 61 38 - 126 U/L   Total Bilirubin 1.0 0.0 - 1.2 mg/dL   GFR, Estimated 49 (L) >60 mL/min   Anion gap 11 5 - 15  Resp panel by RT-PCR (RSV, Flu A&B, Covid) Anterior Nasal Swab   Collection Time: 09/03/23 10:00 PM   Specimen: Anterior Nasal Swab  Result Value Ref Range   SARS Coronavirus 2 by RT PCR NEGATIVE NEGATIVE   Influenza A by PCR NEGATIVE NEGATIVE   Influenza B by PCR NEGATIVE NEGATIVE   Resp Syncytial Virus by PCR NEGATIVE NEGATIVE  Urinalysis, Routine w reflex microscopic -Urine, Clean Catch   Collection Time: 09/03/23 10:00 PM  Result Value Ref Range   Color, Urine YELLOW YELLOW   APPearance HAZY (A) CLEAR   Specific Gravity, Urine 1.020 1.005 - 1.030   pH 6.0 5.0 - 8.0   Glucose, UA NEGATIVE NEGATIVE mg/dL   Hgb urine dipstick MODERATE (A) NEGATIVE   Bilirubin Urine NEGATIVE NEGATIVE   Ketones, ur NEGATIVE NEGATIVE mg/dL   Protein, ur NEGATIVE NEGATIVE mg/dL   Nitrite NEGATIVE NEGATIVE   Leukocytes,Ua TRACE (A) NEGATIVE   RBC / HPF 0-5 0 - 5 RBC/hpf   WBC, UA 0-5 0 - 5 WBC/hpf   Bacteria, UA NONE SEEN NONE SEEN   Squamous Epithelial / HPF 6-10 0 - 5 /HPF   Mucus PRESENT   CBG monitoring, ED   Collection Time: 09/03/23 10:10 PM  Result Value Ref Range   Glucose-Capillary 177 (H) 70 - 99 mg/dL       Pertinent labs & imaging results that were available during my care of the patient were reviewed by me and considered in my medical decision making.  Assessment & Plan:  Kristina "Scarlette Calico" was seen today for herpes zoster.  Diagnoses and all orders for this  visit:  Ramsay Hunt auricular syndrome -     Ambulatory referral to ENT -     Ambulatory referral to Neurology -     predniSONE (DELTASONE) 20 MG tablet; Take 2 tablets (40 mg total) by mouth daily with breakfast for 5 days. -     valACYclovir (VALTREX) 1000 MG tablet; Take 1 tablet (1,000 mg total) by mouth 3 (three) times daily for 7 days.  Vertigo -     Ambulatory referral  to ENT -     Ambulatory referral to Neurology -     predniSONE (DELTASONE) 20 MG tablet; Take 2 tablets (40 mg total) by mouth daily with breakfast for 5 days. -     valACYclovir (VALTREX) 1000 MG tablet; Take 1 tablet (1,000 mg total) by mouth 3 (three) times daily for 7 days.     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 Symptoms include ear pain, dizziness, and facial droop. Initial treatment with amoxicillin and Augmentin was ineffective. Diagnosis confirmed by Harlow Mares. Completed 10-day course of valacyclovir and prednisone with persistent symptoms. Risks of untreated syndrome include long-term complications such as persistent dizziness and facial paralysis. Benefits of continued antiviral and steroid treatment include symptom reduction and prevention of further complications. Urgent ENT and neurology referrals needed. - Prescribe valacyclovir 3 times a day for 7 days - Prescribe prednisone for 5 days - Refer to ENT urgently - Refer to neurology urgently - Provide information on Ramsay Hunt Syndrome - Advise hydration and slow positional changes - Instruct to contact office if symptoms worsen or new symptoms develop - Advise to call office by Monday if no contact from referrals.          Continue all other maintenance medications.  Follow up plan: Return if symptoms worsen or fail to improve.   Continue healthy lifestyle choices, including diet (rich in fruits, vegetables, and lean proteins, and low in salt and simple carbohydrates) and exercise  (at least 30 minutes of moderate physical activity daily).  Educational handout given for Ramsay Hunt Syndrome  The above assessment and management plan was discussed with the patient. The patient verbalized understanding of and has agreed to the management plan. Patient is aware to call the clinic if they develop any new symptoms or if symptoms persist or worsen. Patient is aware when to return to the clinic for a follow-up visit. Patient educated on when it is appropriate to go to the emergency department.   Kari Baars, FNP-C [facility] [phone]

## 2023-09-27 ENCOUNTER — Encounter: Payer: Self-pay | Admitting: Neurology

## 2023-09-27 ENCOUNTER — Ambulatory Visit: Payer: Medicare HMO | Admitting: Neurology

## 2023-09-27 ENCOUNTER — Encounter (INDEPENDENT_AMBULATORY_CARE_PROVIDER_SITE_OTHER): Payer: Self-pay | Admitting: Otolaryngology

## 2023-09-27 VITALS — BP 111/77 | HR 77 | Ht 64.5 in | Wt 211.0 lb

## 2023-09-27 DIAGNOSIS — R42 Dizziness and giddiness: Secondary | ICD-10-CM | POA: Diagnosis not present

## 2023-09-27 DIAGNOSIS — B029 Zoster without complications: Secondary | ICD-10-CM

## 2023-09-27 DIAGNOSIS — R519 Headache, unspecified: Secondary | ICD-10-CM | POA: Diagnosis not present

## 2023-09-27 DIAGNOSIS — H9202 Otalgia, left ear: Secondary | ICD-10-CM | POA: Diagnosis not present

## 2023-09-27 NOTE — Progress Notes (Signed)
 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  She is a 78 year old woman with hypertension, hyperlipidemia.  In early February, she had tenderness on the side of the left face and then she developed ear pain and ringing.    She had swelling in the back of the left tongue and saw her PCP.  Strep throat was suspected and she was placed on an antibiotic.    A few days later, she used a heating pad and the next day felt her left face was on fire.   She saw PCP and was found to have increased ear wax.   She also had severe dizziness and had vomiting when she moved.  On 09/03/2023, she was on the toilet and then felt hot with sweats and felt she would pass out.   She was taken to the ED.  She had EKG, and blood work.   She had severe ear pain and when she went home, her granddaughter cleaned out her ears and she was noted to have some blood and blisters.     Symptoms persisted and she did a virtual Urgent Care visit.    She was placed on valacyclovir 1000 mg tid, prednisone and meclizine.   After a few days symptoms improved.  She has only been able to walk without support x 2-3 days but is not back to baseline.  She never had rotational vertigo but did feel she was being pushed to  the left.    She did not have any  facial weakness.  Hearing was not affected.  Taste was altered initially but is near normal now.     She is currently reporting milder left facial pain near the ear and some soreness in her upper throat.   She started gabapentin 100 mg po tid. however, she has stopped during the day as it made her sleepy.   Pain is now mostly t night.     She has had some nausea and vomiting.  In the office she had vomiting x 1 that was not preceded by vertigo or nausea.  Testing for Covid-19, Infuenza and RSV were negative last month.     REVIEW OF SYSTEMS: Constitutional: No fevers, chills, sweats, or change in appetite Eyes: No visual changes, double vision, eye pain Ear, nose and throat: No hearing loss, ear pain, nasal congestion, sore throat.  As above. Cardiovascular: No chest pain, palpitations Respiratory:  No shortness of breath at rest or with exertion.   No wheezes GastrointestinaI: No nausea, vomiting, diarrhea, abdominal pain, fecal incontinence Genitourinary:  No dysuria, urinary retention or frequency.  No nocturia. Musculoskeletal:  No neck pain, back pain Integumentary: No rash, pruritus, skin lesions Neurological: as above Psychiatric: No depression at this time.  No anxiety Endocrine: No palpitations, diaphoresis, change in appetite, change in weigh or increased thirst Hematologic/Lymphatic:  No anemia, purpura, petechiae. Allergic/Immunologic:  No itchy/runny eyes, nasal congestion, recent allergic reactions, rashes  ALLERGIES: No Known Allergies  HOME MEDICATIONS:  Current Outpatient Medications:    aspirin EC 81 MG tablet, Take 81 mg by mouth daily., Disp: , Rfl:    atorvastatin (LIPITOR) 80 MG tablet, Take 1 tablet (80 mg total) by mouth at bedtime., Disp: 90 tablet, Rfl: 1   escitalopram (LEXAPRO) 10 MG tablet, Take 1 tablet (10 mg total) by mouth daily., Disp: 90 tablet, Rfl: 5   folic acid (FOLVITE) 1 MG tablet, Take by mouth., Disp: , Rfl:     gabapentin (NEURONTIN) 100 MG capsule, Take 1 capsule (100 mg total) by mouth 3 (three) times daily., Disp: 90 capsule, Rfl: 0   ibuprofen (ADVIL) 200 MG tablet, Take by mouth., Disp: , Rfl:    lisinopril (ZESTRIL) 10 MG tablet, Take 1 tablet (10 mg total) by mouth daily., Disp: 90 tablet, Rfl: 1   metoprolol succinate (TOPROL-XL) 50 MG 24 hr tablet, Take 1 tablet (50 mg total) by mouth daily., Disp: 90 tablet, Rfl: 1   omega-3 fish oil (MAXEPA) 1000 MG CAPS capsule, Take 1 capsule by mouth daily., Disp: , Rfl:    omeprazole (PRILOSEC) 20 MG capsule, Take 20 mg by mouth daily., Disp: , Rfl:    Semaglutide, 1 MG/DOSE, 4 MG/3ML SOPN, Inject 1 mg as directed once a week., Disp: 3 mL, Rfl: 3   valACYclovir (VALTREX) 1000 MG tablet, Take 1 tablet (1,000 mg total) by mouth 3 (three) times daily for 7 days., Disp: 21 tablet, Rfl: 0  PAST MEDICAL HISTORY: Past Medical History:  Diagnosis Date   Diabetes mellitus without complication (HCC)    Hypertension     PAST SURGICAL HISTORY: Past Surgical History:  Procedure Laterality Date   DILATION AND CURETTAGE OF UTERUS     HYSTEROSCOPY WITH D & C N/A 02/13/2015   Procedure: HYSTEROSCOPY, UTERINE CURETTAGE;  Surgeon: Lazaro Arms, MD;  Location: [facility];  Service: Gynecology;  Laterality: N/A;    FAMILY HISTORY: Family History  Problem Relation Age of Onset   Cancer Mother        cervical   Cancer Maternal Aunt        lung   Cancer Maternal Uncle    Diabetes Maternal Grandmother    Heart disease Paternal Grandfather     SOCIAL HISTORY: Social History   Socioeconomic History   Marital status: Married    Spouse name: Not on file   Number of children: Not on file   Years of education: Not on file   Highest education level: Not on file  Occupational History   Not on file  Tobacco Use   Smoking status: Never   Smokeless tobacco: Never  Vaping Use   Vaping status: Never Used  Substance and Sexual Activity   Alcohol use: No     Alcohol/week: 0.0 standard drinks of alcohol   Drug use: No   Sexual activity: Not Currently  Other Topics Concern   Not on file  Social History Narrative   Not on file   Social Drivers of Health   Financial Resource Strain: Low Risk  (12/13/2022)   Overall Financial Resource Strain (CARDIA)    Difficulty of Paying Living Expenses: Not hard at all  Food Insecurity: No Food Insecurity (12/13/2022)   Hunger Vital Sign    Worried About Running Out of Food in the Last Year: Never true    Ran Out of Food in the Last Year: Never true  Transportation Needs: No Transportation Needs (12/13/2022)   PRAPARE - Administrator, Civil Service (Medical): No    Lack of Transportation (Non-Medical): No  Physical Activity: Insufficiently Active (12/13/2022)   Exercise Vital Sign    Days of Exercise per Week: 1 day    Minutes of Exercise per Session: 10 min  Stress: No Stress Concern Present (12/13/2022)   [employer] of Occupational Health - Occupational Stress Questionnaire    Feeling of Stress : Only a little  Social Connections: Moderately Integrated (12/13/2022)   Social Connection and Isolation Panel [NHANES]    Frequency of Communication with Friends and Family: More than three times a week    Frequency of Social Gatherings with Friends and Family: More than three times a week    Attends Religious Services: 1 to 4 times per year    Active Member of Clubs or Organizations: No    Attends Banker Meetings: Never    Marital Status: Married  Catering manager Violence: Not At Risk (12/13/2022)   Humiliation, Afraid, Rape, and Kick questionnaire    Fear of Current or Ex-Partner: No    Emotionally Abused: No    Physically Abused: No    Sexually Abused: No       PHYSICAL EXAM  Vitals:   09/27/23 1535 09/27/23 1536 09/27/23 1538  BP: 136/80 132/78 111/77  Pulse: 82 77 77  Weight: 211 lb (95.7 kg)    Height: 5' 4.5" (1.638 m)      Body mass index is 35.66  kg/m.  Orthostatic VS for the past 72 hrs (Last 3 readings):  Patient Position BP Location Cuff Size  09/27/23 1538 Standing Left Arm Large  09/27/23 1536 Sitting Left Arm Large  09/27/23 1535 Lying left side Left Arm Large     General: The patient is well-developed and well-nourished and in no acute distress  HEENT:  Head is [place]/AT.  Sclera are anicteric.  The pharynx is nonerythematous.  No lesions inside the mouth noted.  She did have several scabbed over vesicles in the ear canal on the left.  Neck: No carotid bruits are noted.  The neck is nontender.  Cardiovascular: The heart has a regular rate and rhythm with a normal S1 and S2. There were no murmurs, gallops or rubs.    Skin: Extremities are without rash or  edema.  Musculoskeletal:  Back is nontender  Neurologic Exam  Mental status: The patient is alert and oriented x 3 at the time of the examination. The patient has apparent normal recent and remote memory, with an apparently normal attention span and concentration ability.   Speech is normal.  Cranial nerves: Extraocular movements are full. Pupils are equal, round, and reactive to light and accomodation.  Visual fields are full.  Facial symmetry is present. There is good facial sensation to soft touch bilaterally.Facial strength is normal.  Trapezius and sternocleidomastoid strength is normal. No dysarthria is noted.  The tongue is midline, and the patient has symmetric elevation of the soft palate. No obvious hearing deficits are noted.  Motor:  Muscle bulk is normal.   Tone is normal. Strength is  5 / 5 in all 4 extremities.   Sensory: Sensory testing is intact to pinprick, soft touch and vibration sensation in all 4 extremities.  Coordination: Cerebellar testing reveals good finger-nose-finger and heel-to-shin bilaterally.  Gait and station: Station is normal.   Gait has a reduced stride and is ataxic.  She cannot do tandem walk.  Romberg is negative.   Reflexes: Deep  tendon reflexes are symmetric and normal bilaterally.   Plantar responses are flexor.    DIAGNOSTIC DATA (LABS, IMAGING, TESTING) - I reviewed patient records, labs, notes, testing and imaging myself where available.  Lab Results  Component Value Date   WBC 7.6 09/03/2023   HGB 13.5 09/03/2023   HCT 39.0 09/03/2023   MCV 87.4 09/03/2023   PLT 189 09/03/2023      Component Value Date/Time   NA 132 (L) 09/03/2023 2151   NA 138 07/13/2023 1011   K 4.3 09/03/2023 2151   CL 94 (L) 09/03/2023 2151   CO2 27 09/03/2023 2151   GLUCOSE 186 (H) 09/03/2023 2151   BUN 19 09/03/2023 2151   BUN 15 07/13/2023 1011   CREATININE 1.15 (H) 09/03/2023 2151   CALCIUM 9.6 09/03/2023 2151   PROT 7.5 09/03/2023 2151   PROT 6.7 07/13/2023 1011   ALBUMIN 3.9 09/03/2023 2151   ALBUMIN 4.3 07/13/2023 1011   AST 19 09/03/2023 2151   ALT 20 09/03/2023 2151   ALKPHOS 61 09/03/2023 2151   BILITOT 1.0 09/03/2023 2151   BILITOT 0.7 07/13/2023 1011   GFRNONAA 49 (L) 09/03/2023 2151   GFRAA 42 (L) 02/11/2015 1400   Lab Results  Component Value Date   CHOL 130 01/18/2023   HDL 53 01/18/2023   LDLCALC 50 01/18/2023   TRIG 159 (H) 01/18/2023   CHOLHDL 2.5 01/18/2023   Lab Results  Component Value Date   HGBA1C 6.5 (H) 07/13/2023   No results found for: "VITAMINB12" Lab Results  Component Value Date   TSH 3.010 04/06/2023       ASSESSMENT AND PLAN  Vertigo - Plan: MR BRAIN/IAC W WO CONTRAST, Sedimentation rate, C-reactive protein, Varicella Zoster Abs, IgG/IgM  Left ear pain - Plan: MR BRAIN/IAC W WO CONTRAST, Sedimentation rate, C-reactive protein, Varicella Zoster Abs, IgG/IgM  Left facial pain - Plan: MR BRAIN/IAC W WO CONTRAST, Sedimentation rate, C-reactive protein, Varicella Zoster Abs, IgG/IgM  Herpes zoster without complication - Plan: Varicella Zoster Abs, IgG/IgM   In summary, Ms. Suber is a 78 year old woman who had the onset of severe left facial pain centered around and in  the ear who has also experienced ataxia, vertigo and vomiting.  She did have a couple of scabbed over vesicles inside of the ear putting Ramsay Hunt syndrome in the differential diagnosis.  However, she never had facial weakness which is a hallmark of that disorder.  Additionally, the extent of her vertigo would be unusual.  Because of the vertigo and nausea, a postviral cerebellitis is also possible.  She is improving and feels quite a bit better now than she did a couple weeks ago.  We will go ahead and check ESR, CRP and varicella IgM/IgG.  An MRI of the brain will be checked to further evaluate for inflammatory or infectious or other etiology of her symptoms.  If she continues to improve and the studies are negative, no further evaluation or treatment would be necessary.  She could continue on the gabapentin and stop after several more weeks.  If pain worsens we would consider other medications since she cannot tolerate higher dose of gabapentin.  Additionally, if symptoms worsen consider a paraneoplastic profile.  I did not schedule a follow-up as she is improving.  However, will be called with the results of the studies we will get an update and consider further follow-up based on her disease course.  They will also call if  new or worsening neurologic symptoms.  Thank you for asking me to see Ms. Daphine Deutscher.  Please let me know if I can be of further assistance with her or other patients in the future.  Jayden Rudge A. Epimenio Foot, MD, [facility] 09/27/2023, 3:46 PM Certified in Neurology, Clinical Neurophysiology, Sleep Medicine and Neuroimaging  [facility] [address], [address] [place], [place] [address] [phone]

## 2023-09-28 ENCOUNTER — Encounter: Payer: Self-pay | Admitting: Neurology

## 2023-09-28 LAB — C-REACTIVE PROTEIN: CRP: <1 mg/L (ref 0–10)

## 2023-09-28 LAB — VARICELLA ZOSTER ABS, IGG/IGM
Varicella IgM: <0.91 {index} (ref 0.00–0.90)
Varicella zoster IgG: REACTIVE

## 2023-09-28 LAB — SEDIMENTATION RATE: Sed Rate: 2 mm/h (ref 0–40)

## 2023-10-03 ENCOUNTER — Telehealth: Payer: Self-pay | Admitting: Neurology

## 2023-10-03 NOTE — Telephone Encounter (Signed)
 sent to GI they obtain [facility] Berkley Harvey [phone]

## 2023-10-12 ENCOUNTER — Ambulatory Visit (INDEPENDENT_AMBULATORY_CARE_PROVIDER_SITE_OTHER): Payer: Medicare HMO | Admitting: Family Medicine

## 2023-10-12 ENCOUNTER — Encounter: Payer: Self-pay | Admitting: Family Medicine

## 2023-10-12 VITALS — BP 118/70 | HR 74 | Temp 97.4°F | Ht 64.5 in | Wt 209.0 lb

## 2023-10-12 DIAGNOSIS — E1122 Type 2 diabetes mellitus with diabetic chronic kidney disease: Secondary | ICD-10-CM

## 2023-10-12 DIAGNOSIS — I152 Hypertension secondary to endocrine disorders: Secondary | ICD-10-CM

## 2023-10-12 DIAGNOSIS — E1159 Type 2 diabetes mellitus with other circulatory complications: Secondary | ICD-10-CM

## 2023-10-12 DIAGNOSIS — N184 Chronic kidney disease, stage 4 (severe): Secondary | ICD-10-CM | POA: Diagnosis not present

## 2023-10-12 DIAGNOSIS — E1169 Type 2 diabetes mellitus with other specified complication: Secondary | ICD-10-CM | POA: Diagnosis not present

## 2023-10-12 DIAGNOSIS — B0221 Postherpetic geniculate ganglionitis: Secondary | ICD-10-CM

## 2023-10-12 DIAGNOSIS — E785 Hyperlipidemia, unspecified: Secondary | ICD-10-CM

## 2023-10-12 LAB — BAYER DCA HB A1C WAIVED: HB A1C (BAYER DCA - WAIVED): 6.9 % — ABNORMAL HIGH (ref 4.8–5.6)

## 2023-10-12 NOTE — Progress Notes (Signed)
 Subjective:  Patient ID: Kristina Peterson, female    DOB: [birth year], 78 y.o.   MRN: [id number]  Patient Care Team: Sonny Masters, FNP as PCP - General (Family Medicine) [facility]   Chief Complaint:  Diabetes (3 month follow up ) and Dizziness (While she had shingles in her ear since Feb)   HPI: Kristina Peterson is a 79 y.o. female presenting on 10/12/2023 for Diabetes (3 month follow up ) and Dizziness (While she had shingles in her ear since Feb)   Discussed the use of AI scribe software for clinical note transcription with the patient, who gave verbal consent to proceed.  History of Present Illness   Kristina Peterson "Kristina Peterson" is a 78 year old female who presents with shingles and dizziness. She is accompanied by her daughter.  She has been experiencing shingles, initially presenting on her tongue, leading to a metallic taste and aversion to certain foods, including coffee. Although her symptoms have improved, they are not completely resolved. She has not consumed coffee in two months due to the taste alteration and is trying to find foods that appeal to her taste buds, such as sour, salty, or sweet items.  She experiences dizziness that affects her balance, causing her to walk 'sideways' or 'a little crooked.' She has been evaluated by a neurologist and is awaiting an MRI to further investigate her symptoms. She also received a call from an ENT for an appointment, which she initially did not schedule but plans to follow up on.  Her diabetes is currently well-controlled, and she has no increased hunger, thirst, or urination. She reports a suppressed appetite, possibly due to her medication, Ozempic, and the altered taste from shingles. Her recent meals included oatmeal, a barbecue sandwich, and a banana peanut butter sandwich. Her weight has slightly decreased from 211 to 209 pounds, but she is not experiencing drastic weight changes.  She is currently taking lisinopril and  metoprolol for blood pressure management and atorvastatin for cholesterol. She has taken ibuprofen occasionally for pain but is cautious about the dosage due to concerns about her kidneys.          Relevant past medical, surgical, family, and social history reviewed and updated as indicated.  Allergies and medications reviewed and updated. Data reviewed: Chart in Epic.   Past Medical History:  Diagnosis Date   Diabetes mellitus without complication (HCC)    Hypertension     Past Surgical History:  Procedure Laterality Date   DILATION AND CURETTAGE OF UTERUS     HYSTEROSCOPY WITH D & C N/A 02/13/2015   Procedure: HYSTEROSCOPY, UTERINE CURETTAGE;  Surgeon: Lazaro Arms, MD;  Location: [facility];  Service: Gynecology;  Laterality: N/A;    Social History   Socioeconomic History   Marital status: Married    Spouse name: Not on file   Number of children: Not on file   Years of education: Not on file   Highest education level: Not on file  Occupational History   Not on file  Tobacco Use   Smoking status: Never   Smokeless tobacco: Never  Vaping Use   Vaping status: Never Used  Substance and Sexual Activity   Alcohol use: No    Alcohol/week: 0.0 standard drinks of alcohol   Drug use: No   Sexual activity: Not Currently  Other Topics Concern   Not on file  Social History Narrative   Not on file   Social Drivers of  Health   Financial Resource Strain: Low Risk  (12/13/2022)   Overall Financial Resource Strain (CARDIA)    Difficulty of Paying Living Expenses: Not hard at all  Food Insecurity: No Food Insecurity (12/13/2022)   Hunger Vital Sign    Worried About Running Out of Food in the Last Year: Never true    Ran Out of Food in the Last Year: Never true  Transportation Needs: No Transportation Needs (12/13/2022)   PRAPARE - Administrator, Civil Service (Medical): No    Lack of Transportation (Non-Medical): No  Physical Activity: Insufficiently Active  (12/13/2022)   Exercise Vital Sign    Days of Exercise per Week: 1 day    Minutes of Exercise per Session: 10 min  Stress: No Stress Concern Present (12/13/2022)   [employer] of Occupational Health - Occupational Stress Questionnaire    Feeling of Stress : Only a little  Social Connections: Moderately Integrated (12/13/2022)   Social Connection and Isolation Panel [NHANES]    Frequency of Communication with Friends and Family: More than three times a week    Frequency of Social Gatherings with Friends and Family: More than three times a week    Attends Religious Services: 1 to 4 times per year    Active Member of [employer] or Organizations: No    Attends Banker Meetings: Never    Marital Status: Married  Catering manager Violence: Not At Risk (12/13/2022)   Humiliation, Afraid, Rape, and Kick questionnaire    Fear of Current or Ex-Partner: No    Emotionally Abused: No    Physically Abused: No    Sexually Abused: No    Outpatient Encounter Medications as of 10/12/2023  Medication Sig   aspirin EC 81 MG tablet Take 81 mg by mouth daily.   atorvastatin (LIPITOR) 80 MG tablet Take 1 tablet (80 mg total) by mouth at bedtime.   escitalopram (LEXAPRO) 10 MG tablet Take 1 tablet (10 mg total) by mouth daily.   folic acid (FOLVITE) 1 MG tablet Take by mouth.   ibuprofen (ADVIL) 200 MG tablet Take by mouth.   lisinopril (ZESTRIL) 10 MG tablet Take 1 tablet (10 mg total) by mouth daily.   metoprolol succinate (TOPROL-XL) 50 MG 24 hr tablet Take 1 tablet (50 mg total) by mouth daily.   omega-3 fish oil (MAXEPA) 1000 MG CAPS capsule Take 1 capsule by mouth daily.   omeprazole (PRILOSEC) 20 MG capsule Take 20 mg by mouth daily.   Semaglutide, 1 MG/DOSE, 4 MG/3ML SOPN Inject 1 mg as directed once a week.   [DISCONTINUED] gabapentin (NEURONTIN) 100 MG capsule Take 1 capsule (100 mg total) by mouth 3 (three) times daily. (Patient not taking: Reported on 10/12/2023)   No  facility-administered encounter medications on file as of 10/12/2023.    No Known Allergies  Pertinent ROS per HPI, otherwise unremarkable      Objective:  BP 118/70   Pulse 74   Temp (!) 97.4 F (36.3 C)   Ht 5' 4.5" (1.638 m)   Wt 209 lb (94.8 kg)   SpO2 96%   BMI 35.32 kg/m    Wt Readings from Last 3 Encounters:  10/12/23 209 lb (94.8 kg)  09/27/23 211 lb (95.7 kg)  09/21/23 211 lb 3.2 oz (95.8 kg)    Physical Exam Vitals and nursing note reviewed.  Constitutional:      Appearance: Normal appearance. She is obese.  HENT:     Head: Normocephalic and atraumatic.  Ears:      Nose: Nose normal.     Mouth/Throat:     Mouth: Mucous membranes are moist.     Pharynx: Oropharynx is clear.  Eyes:     Conjunctiva/sclera: Conjunctivae normal.     Pupils: Pupils are equal, round, and reactive to light.  Cardiovascular:     Rate and Rhythm: Normal rate and regular rhythm.     Heart sounds: Normal heart sounds.  Pulmonary:     Effort: Pulmonary effort is normal.     Breath sounds: Normal breath sounds.  Musculoskeletal:     Cervical back: Neck supple.     Right lower leg: No edema.     Left lower leg: No edema.  Skin:    General: Skin is warm and dry.     Capillary Refill: Capillary refill takes less than 2 seconds.  Neurological:     General: No focal deficit present.     Mental Status: She is alert and oriented to person, place, and time.     Cranial Nerves: Facial asymmetry (slight left facial droop) present.  Psychiatric:        Mood and Affect: Mood normal.        Behavior: Behavior normal.        Thought Content: Thought content normal.        Judgment: Judgment normal.       Results for orders placed or performed in visit on 10/12/23  Bayer DCA Hb A1c Waived   Collection Time: 10/12/23 10:20 AM  Result Value Ref Range   HB A1C (BAYER DCA - WAIVED) 6.9 (H) 4.8 - 5.6 %       Pertinent labs & imaging results that were available during my care of  the patient were reviewed by me and considered in my medical decision making.  Assessment & Plan:  Kristina Peterson "Kristina Peterson" was seen today for diabetes and dizziness.  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 Concern for Ramsay Hunt syndrome due to a vesicle on the left eardrum and slight facial droop on the left side, though no facial paralysis is present. Treatment plan remains unchanged regardless of specific diagnosis. - Schedule MRI to assess for additional complications - Contact ENT for appointment and cancellation list placement - Use ibuprofen or acetaminophen for pain management, limiting ibuprofen to once or twice daily due to potential renal concerns  Type 2 Diabetes Mellitus Diabetes is well-controlled with an A1c of 6.9. Reports suppressed appetite, likely due to Ozempic, and a metallic taste affecting food intake. No significant weight loss observed. Decision made not to increase Ozempic dose due to current A1c goal achievement and appetite suppression. - Continue current dose of Ozempic -  Check urine for protein to monitor renal function  Hypertension Blood pressure is well-controlled with current medication regimen. - Continue current medications: lisinopril and metoprolol  Hyperlipidemia On atorvastatin for cholesterol management. -  Continue atorvastatin          Continue all other maintenance medications.  Follow up plan: Return in about 3 months (around 01/12/2024) for DM.   Continue healthy lifestyle choices, including diet (rich in fruits, vegetables, and lean proteins, and low in salt and simple carbohydrates) and exercise (at least 30 minutes of moderate physical activity daily).  Educational handout given for DM  The above assessment and management plan was discussed with the patient. The patient verbalized understanding of and has agreed to the management plan. Patient is aware to call the clinic if they develop any new symptoms or if symptoms persist or worsen. Patient is aware when to return to the clinic for a follow-up visit. Patient educated on when it is appropriate to go to the emergency department.   Kari Baars, FNP-C [facility] [phone]

## 2023-10-12 NOTE — Patient Instructions (Addendum)

## 2023-10-13 LAB — CMP14+EGFR
ALT: 35 IU/L — ABNORMAL HIGH (ref 0–32)
AST: 14 IU/L (ref 0–40)
Albumin: 4.1 g/dL (ref 3.8–4.8)
Alkaline Phosphatase: 88 IU/L (ref 44–121)
BUN/Creatinine Ratio: 9 — ABNORMAL LOW (ref 12–28)
BUN: 11 mg/dL (ref 8–27)
Bilirubin Total: 0.9 mg/dL (ref 0.0–1.2)
CO2: 21 mmol/L (ref 20–29)
Calcium: 9.5 mg/dL (ref 8.7–10.3)
Chloride: 101 mmol/L (ref 96–106)
Creatinine, Ser: 1.27 mg/dL — ABNORMAL HIGH (ref 0.57–1.00)
Globulin, Total: 2 g/dL (ref 1.5–4.5)
Glucose: 149 mg/dL — ABNORMAL HIGH (ref 70–99)
Potassium: 5 mmol/L (ref 3.5–5.2)
Sodium: 137 mmol/L (ref 134–144)
Total Protein: 6.1 g/dL (ref 6.0–8.5)
eGFR: 43 mL/min/{1.73_m2} — ABNORMAL LOW (ref >59)

## 2023-10-13 LAB — MICROALBUMIN / CREATININE URINE RATIO
Creatinine, Urine: 199.1 mg/dL
Microalb/Creat Ratio: 4 mg/g{creat} (ref 0–29)
Microalbumin, Urine: 7 ug/mL

## 2023-10-13 MED ORDER — TIRZEPATIDE 5 MG/0.5ML ~~LOC~~ SOAJ
5.0000 mg | SUBCUTANEOUS | 3 refills | Status: DC
Start: 2023-10-13 — End: 2024-01-23

## 2023-10-13 MED ORDER — DAPAGLIFLOZIN PROPANEDIOL 5 MG PO TABS: 5.0000 mg | ORAL_TABLET | Freq: Every day | ORAL | 1 refills | Status: DC

## 2023-10-13 NOTE — Addendum Note (Signed)
 Addended by: Sonny Masters on: 10/13/2023 07:51 AM   Modules accepted: Orders

## 2023-10-13 NOTE — Addendum Note (Signed)
 Addended by: Sonny Masters on: 10/13/2023 09:53 AM   Modules accepted: Orders

## 2023-11-16 ENCOUNTER — Encounter: Payer: Self-pay | Admitting: Neurology

## 2023-11-26 ENCOUNTER — Encounter: Payer: Self-pay | Admitting: Neurology

## 2023-11-26 ENCOUNTER — Ambulatory Visit
Admission: RE | Admit: 2023-11-26 | Discharge: 2023-11-26 | Disposition: A | Discharge status: Home / Self Care | Source: Ambulatory Visit | Attending: Neurology | Admitting: Neurology

## 2023-11-26 DIAGNOSIS — R42 Dizziness and giddiness: Secondary | ICD-10-CM | POA: Diagnosis not present

## 2023-11-26 DIAGNOSIS — H9202 Otalgia, left ear: Secondary | ICD-10-CM

## 2023-11-26 DIAGNOSIS — R519 Headache, unspecified: Secondary | ICD-10-CM

## 2023-11-26 MED ORDER — GADOPICLENOL 0.5 MMOL/ML IV SOLN
10.0000 mL | Freq: Once | INTRAVENOUS | Status: AC | PRN
  Administered 2023-11-26: 10 mL via INTRAVENOUS

## 2024-01-23 ENCOUNTER — Ambulatory Visit (INDEPENDENT_AMBULATORY_CARE_PROVIDER_SITE_OTHER): Admitting: Family Medicine

## 2024-01-23 ENCOUNTER — Encounter: Payer: Self-pay | Admitting: Family Medicine

## 2024-01-23 VITALS — BP 128/76 | HR 75 | Temp 97.3°F | Ht 64.5 in | Wt 202.2 lb

## 2024-01-23 DIAGNOSIS — E785 Hyperlipidemia, unspecified: Secondary | ICD-10-CM

## 2024-01-23 DIAGNOSIS — E1159 Type 2 diabetes mellitus with other circulatory complications: Secondary | ICD-10-CM | POA: Diagnosis not present

## 2024-01-23 DIAGNOSIS — I152 Hypertension secondary to endocrine disorders: Secondary | ICD-10-CM

## 2024-01-23 DIAGNOSIS — E1169 Type 2 diabetes mellitus with other specified complication: Secondary | ICD-10-CM

## 2024-01-23 DIAGNOSIS — F320 Major depressive disorder, single episode, mild: Secondary | ICD-10-CM | POA: Diagnosis not present

## 2024-01-23 DIAGNOSIS — Z7985 Long-term (current) use of injectable non-insulin antidiabetic drugs: Secondary | ICD-10-CM

## 2024-01-23 DIAGNOSIS — E1122 Type 2 diabetes mellitus with diabetic chronic kidney disease: Secondary | ICD-10-CM | POA: Diagnosis not present

## 2024-01-23 DIAGNOSIS — M79672 Pain in left foot: Secondary | ICD-10-CM

## 2024-01-23 DIAGNOSIS — N184 Chronic kidney disease, stage 4 (severe): Secondary | ICD-10-CM

## 2024-01-23 LAB — BAYER DCA HB A1C WAIVED: HB A1C (BAYER DCA - WAIVED): 6.8 % — ABNORMAL HIGH (ref 4.8–5.6)

## 2024-01-23 MED ORDER — ATORVASTATIN CALCIUM 80 MG PO TABS: 80.0000 mg | ORAL_TABLET | Freq: Every day | ORAL | 1 refills | Status: DC

## 2024-01-23 MED ORDER — LISINOPRIL 10 MG PO TABS: 10.0000 mg | ORAL_TABLET | Freq: Every day | ORAL | 1 refills | Status: DC

## 2024-01-23 MED ORDER — PREDNISONE 20 MG PO TABS: 40.0000 mg | ORAL_TABLET | Freq: Every day | ORAL | 0 refills | Status: AC

## 2024-01-23 MED ORDER — METOPROLOL SUCCINATE ER 50 MG PO TB24
50.0000 mg | ORAL_TABLET | Freq: Every day | ORAL | 1 refills | Status: AC
Start: 2024-01-23 — End: ?

## 2024-01-23 MED ORDER — ESCITALOPRAM OXALATE 10 MG PO TABS: 10.0000 mg | ORAL_TABLET | Freq: Every day | ORAL | 5 refills | Status: DC

## 2024-01-23 NOTE — Patient Instructions (Addendum)

## 2024-01-23 NOTE — Progress Notes (Signed)
 Subjective:  Patient ID: Kristina Peterson, female    DOB: 12-18-1945, 78 y.o.   MRN: [id number]  Patient Care Team: Severa Rock HERO, FNP as PCP - General (Family Medicine) [facility]   Chief Complaint:  Diabetes (3 month follow up ) and Foot Pain (Left x 2 days - gout? )   HPI: Kristina Peterson is a 78 y.o. female presenting on 01/23/2024 for Diabetes (3 month follow up ) and Foot Pain (Left x 2 days - gout? )   Kristina Peterson is a 77 year old female with gout who presents with a gout flare in her left foot.  Acute monoarticular arthralgia (gout flare) - Severe pain in the left foot, onset yesterday afternoon - Pain described as similar to being struck with an axe - History of gout, but no recent attacks prior to this episode - No significant dietary changes, though consumed a bologna sandwich last week - Took 'gout out' supplement from a health store this morning with some improvement in symptoms  Type 2 diabetes mellitus - Managed with Ozempic  - Unable to obtain Mounjaro  or Farxiga  due to insurance coverage issues - No side effects from current diabetes medication  Hypertension - Managed with metoprolol  and lisinopril  - No side effects such as headaches, chest pain, or leg swelling  Hyperlipidemia - Managed with atorvastatin  - No muscle aches or pains  Dysgeusia and dizziness - Dizziness and sour taste localized to the left side of the mouth - Sour taste worsens with eating and affects appetite - MRI revealed a large cyst on the left side - Sphenoid mucosal abnormality in the left sinus - Chronic sinus problems - Upcoming appointment with ENT specialist          Relevant past medical, surgical, family, and social history reviewed and updated as indicated.  Allergies and medications reviewed and updated. Data reviewed: Chart in Epic.   Past Medical History:  Diagnosis Date   Diabetes mellitus without complication (HCC)    Hypertension      Past Surgical History:  Procedure Laterality Date   DILATION AND CURETTAGE OF UTERUS     HYSTEROSCOPY WITH D & C N/A 02/13/2015   Procedure: HYSTEROSCOPY, UTERINE CURETTAGE;  Surgeon: Vonn VEAR Inch, MD;  Location: [facility];  Service: Gynecology;  Laterality: N/A;    Social History   Socioeconomic History   Marital status: Married    Spouse name: Not on file   Number of children: Not on file   Years of education: Not on file   Highest education level: Not on file  Occupational History   Not on file  Tobacco Use   Smoking status: Never   Smokeless tobacco: Never  Vaping Use   Vaping status: Never Used  Substance and Sexual Activity   Alcohol use: No    Alcohol/week: 0.0 standard drinks of alcohol   Drug use: No   Sexual activity: Not Currently  Other Topics Concern   Not on file  Social History Narrative   Not on file   Social Drivers of Health   Financial Resource Strain: Low Risk  (12/13/2022)   Overall Financial Resource Strain (CARDIA)    Difficulty of Paying Living Expenses: Not hard at all  Food Insecurity: No Food Insecurity (12/13/2022)   Hunger Vital Sign    Worried About Running Out of Food in the Last Year: Never true    Ran Out of Food in the Last Year: Never  true  Transportation Needs: No Transportation Needs (12/13/2022)   PRAPARE - Administrator, Civil Service (Medical): No    Lack of Transportation (Non-Medical): No  Physical Activity: Insufficiently Active (12/13/2022)   Exercise Vital Sign    Days of Exercise per Week: 1 day    Minutes of Exercise per Session: 10 min  Stress: No Stress Concern Present (12/13/2022)   [employer] of Occupational Health - Occupational Stress Questionnaire    Feeling of Stress : Only a little  Social Connections: Moderately Integrated (12/13/2022)   Social Connection and Isolation Panel    Frequency of Communication with Friends and Family: More than three times a week    Frequency of Social Gatherings  with Friends and Family: More than three times a week    Attends Religious Services: 1 to 4 times per year    Active Member of [employer] or Organizations: No    Attends Banker Meetings: Never    Marital Status: Married  Catering manager Violence: Not At Risk (12/13/2022)   Humiliation, Afraid, Rape, and Kick questionnaire    Fear of Current or Ex-Partner: No    Emotionally Abused: No    Physically Abused: No    Sexually Abused: No    Outpatient Encounter Medications as of 01/23/2024  Medication Sig   aspirin EC 81 MG tablet Take 81 mg by mouth daily.   folic acid (FOLVITE) 1 MG tablet Take by mouth.   ibuprofen (ADVIL) 200 MG tablet Take by mouth.   omega-3 fish oil (MAXEPA) 1000 MG CAPS capsule Take 1 capsule by mouth daily.   omeprazole (PRILOSEC) 20 MG capsule Take 20 mg by mouth daily.   OZEMPIC , 1 MG/DOSE, 4 MG/3ML SOPN Inject 1 mg into the skin once a week.   predniSONE  (DELTASONE ) 20 MG tablet Take 2 tablets (40 mg total) by mouth daily with breakfast for 5 days.   [DISCONTINUED] atorvastatin  (LIPITOR) 80 MG tablet Take 1 tablet (80 mg total) by mouth at bedtime.   [DISCONTINUED] dapagliflozin  propanediol (FARXIGA ) 5 MG TABS tablet Take 1 tablet (5 mg total) by mouth daily before breakfast.   [DISCONTINUED] escitalopram  (LEXAPRO ) 10 MG tablet Take 1 tablet (10 mg total) by mouth daily.   [DISCONTINUED] lisinopril  (ZESTRIL ) 10 MG tablet Take 1 tablet (10 mg total) by mouth daily.   [DISCONTINUED] metoprolol  succinate (TOPROL -XL) 50 MG 24 hr tablet Take 1 tablet (50 mg total) by mouth daily.   atorvastatin  (LIPITOR) 80 MG tablet Take 1 tablet (80 mg total) by mouth at bedtime.   escitalopram  (LEXAPRO ) 10 MG tablet Take 1 tablet (10 mg total) by mouth daily.   lisinopril  (ZESTRIL ) 10 MG tablet Take 1 tablet (10 mg total) by mouth daily.   metoprolol  succinate (TOPROL -XL) 50 MG 24 hr tablet Take 1 tablet (50 mg total) by mouth daily.   [DISCONTINUED] tirzepatide  (MOUNJARO ) 5  MG/0.5ML Pen Inject 5 mg into the skin once a week.   No facility-administered encounter medications on file as of 01/23/2024.    Allergies  Allergen Reactions   No Known Allergies     Pertinent ROS per HPI, otherwise unremarkable      Objective:  BP 128/76   Pulse 75   Temp (!) 97.3 F (36.3 C)   Ht 5' 4.5 (1.638 m)   Wt 202 lb 3.2 oz (91.7 kg)   SpO2 95%   BMI 34.17 kg/m    Wt Readings from Last 3 Encounters:  01/23/24 202 lb 3.2  oz (91.7 kg)  10/12/23 209 lb (94.8 kg)  09/27/23 211 lb (95.7 kg)    Physical Exam Vitals and nursing note reviewed.  Constitutional:      General: She is not in acute distress.    Appearance: Normal appearance. She is obese. She is not ill-appearing, toxic-appearing or diaphoretic.  HENT:     Head: Normocephalic and atraumatic.     Nose: Nose normal.     Mouth/Throat:     Mouth: Mucous membranes are moist.     Pharynx: Oropharynx is clear.   Eyes:     Conjunctiva/sclera: Conjunctivae normal.     Pupils: Pupils are equal, round, and reactive to light.    Cardiovascular:     Rate and Rhythm: Normal rate and regular rhythm.     Pulses: Normal pulses.     Heart sounds: Normal heart sounds.  Pulmonary:     Effort: Pulmonary effort is normal.     Breath sounds: Normal breath sounds.   Musculoskeletal:     Cervical back: Neck supple.     Right lower leg: No edema.     Left lower leg: No edema.       Feet:   Skin:    General: Skin is warm and dry.     Capillary Refill: Capillary refill takes less than 2 seconds.   Neurological:     General: No focal deficit present.     Mental Status: She is alert and oriented to person, place, and time.   Psychiatric:        Mood and Affect: Mood normal.        Behavior: Behavior normal.        Thought Content: Thought content normal.        Judgment: Judgment normal.      Results for orders placed or performed in visit on 10/12/23  Bayer DCA Hb A1c Waived   Collection Time:  10/12/23 10:20 AM  Result Value Ref Range   HB A1C (BAYER DCA - WAIVED) 6.9 (H) 4.8 - 5.6 %  CMP14+EGFR   Collection Time: 10/12/23 10:22 AM  Result Value Ref Range   Glucose 149 (H) 70 - 99 mg/dL   BUN 11 8 - 27 mg/dL   Creatinine, Ser 8.72 (H) 0.57 - 1.00 mg/dL   eGFR 43 (L) >40 fO/fpw/8.26   BUN/Creatinine Ratio 9 (L) 12 - 28   Sodium 137 134 - 144 mmol/L   Potassium 5.0 3.5 - 5.2 mmol/L   Chloride 101 96 - 106 mmol/L   CO2 21 20 - 29 mmol/L   Calcium  9.5 8.7 - 10.3 mg/dL   Total Protein 6.1 6.0 - 8.5 g/dL   Albumin 4.1 3.8 - 4.8 g/dL   Globulin, Total 2.0 1.5 - 4.5 g/dL   Bilirubin Total 0.9 0.0 - 1.2 mg/dL   Alkaline Phosphatase 88 44 - 121 IU/L   AST 14 0 - 40 IU/L   ALT 35 (H) 0 - 32 IU/L  Microalbumin / creatinine urine ratio   Collection Time: 10/12/23 10:55 AM  Result Value Ref Range   Creatinine, Urine 199.1 Not Estab. mg/dL   Microalbumin, Urine 7.0 Not Estab. ug/mL   Microalb/Creat Ratio 4 0 - 29 mg/g creat       Pertinent labs & imaging results that were available during my care of the patient were reviewed by me and considered in my medical decision making.  Assessment & Plan:  Emely Fahy was seen today for diabetes and foot  pain.  Diagnoses and all orders for this visit:  Type 2 diabetes mellitus with stage 4 chronic kidney disease, without long-term current use of insulin (HCC) -     Bayer DCA Hb A1c Waived -     CMP14+EGFR -     lisinopril  (ZESTRIL ) 10 MG tablet; Take 1 tablet (10 mg total) by mouth daily.  Hypertension associated with diabetes (HCC) -     Bayer DCA Hb A1c Waived -     CMP14+EGFR -     metoprolol  succinate (TOPROL -XL) 50 MG 24 hr tablet; Take 1 tablet (50 mg total) by mouth daily. -     lisinopril  (ZESTRIL ) 10 MG tablet; Take 1 tablet (10 mg total) by mouth daily.  Depression, major, single episode, mild (HCC) -     escitalopram  (LEXAPRO ) 10 MG tablet; Take 1 tablet (10 mg total) by mouth daily.  CKD stage 4 due to type 2  diabetes mellitus (HCC) -     Bayer DCA Hb A1c Waived -     CMP14+EGFR -     lisinopril  (ZESTRIL ) 10 MG tablet; Take 1 tablet (10 mg total) by mouth daily.  Hyperlipidemia associated with type 2 diabetes mellitus (HCC) -     Bayer DCA Hb A1c Waived -     CMP14+EGFR -     atorvastatin  (LIPITOR) 80 MG tablet; Take 1 tablet (80 mg total) by mouth at bedtime.  Morbid obesity (HCC) -     Bayer DCA Hb A1c Waived -     CMP14+EGFR  Left foot pain -     predniSONE  (DELTASONE ) 20 MG tablet; Take 2 tablets (40 mg total) by mouth daily with breakfast for 5 days.     Assessment and Plan    Gout Acute gout flare in the left foot with severe pain and erythema. She has not experienced an attack recently. No significant dietary changes identified, though she consumed a bologna sandwich. An over-the-counter supplement provided some relief. A steroid burst is planned to reduce inflammation and pain, with potential temporary elevation of blood glucose levels. 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 MRI showed no structural lesions or tumors. She has an upcoming ENT appointment. A steroid burst is planned to reduce sinus inflammation and potentially alleviate dizziness. Vestibular rehabilitation may be considered if ENT evaluation is inconclusive. - Prescribe a steroid burst to reduce sinus inflammation and potentially alleviate dizziness - Advise to attend ENT appointment on July 28 for further evaluation - Consider vestibular rehabilitation if ENT evaluation is inconclusive  Type 2 Diabetes Mellitus Type 2 diabetes mellitus managed with Ozempic . A1c is 6.8%. She tolerates the medication well without side  effects. Steroid use may temporarily elevate blood glucose levels, expected to be short-term. - Continue Ozempic  at current dose - Monitor A1c levels and adjust medication if levels increase  Hypertension Hypertension managed with metoprolol  and lisinopril . She reports no side effects such as headaches, chest pain, or leg swelling. Blood pressure is well-controlled at home. - Continue current antihypertensive regimen  Hyperlipidemia Hyperlipidemia managed with atorvastatin . She reports no myalgia associated with the medication. - Continue atorvastatin           Continue all other maintenance medications.  Follow up plan: Return  in about 3 months (around 04/24/2024) for chronic follow up, DM.   Continue healthy lifestyle choices, including diet (rich in fruits, vegetables, and lean proteins, and low in salt and simple carbohydrates) and exercise (at least 30 minutes of moderate physical activity daily).  Educational handout given for DM  The above assessment and management plan was discussed with the patient. The patient verbalized understanding of and has agreed to the management plan. Patient is aware to call the clinic if they develop any new symptoms or if symptoms persist or worsen. Patient is aware when to return to the clinic for a follow-up visit. Patient educated on when it is appropriate to go to the emergency department.   Rosaline Bruns, FNP-C [facility] [phone]

## 2024-01-24 ENCOUNTER — Ambulatory Visit: Payer: Self-pay | Admitting: Family Medicine

## 2024-01-24 LAB — CMP14+EGFR
ALT: 23 IU/L (ref 0–32)
AST: 18 IU/L (ref 0–40)
Albumin: 4.3 g/dL (ref 3.8–4.8)
Alkaline Phosphatase: 81 IU/L (ref 44–121)
BUN/Creatinine Ratio: 12 (ref 12–28)
BUN: 17 mg/dL (ref 8–27)
Bilirubin Total: 0.6 mg/dL (ref 0.0–1.2)
CO2: 20 mmol/L (ref 20–29)
Calcium: 9.5 mg/dL (ref 8.7–10.3)
Chloride: 101 mmol/L (ref 96–106)
Creatinine, Ser: 1.47 mg/dL — ABNORMAL HIGH (ref 0.57–1.00)
Globulin, Total: 2 g/dL (ref 1.5–4.5)
Glucose: 154 mg/dL — ABNORMAL HIGH (ref 70–99)
Potassium: 5 mmol/L (ref 3.5–5.2)
Sodium: 138 mmol/L (ref 134–144)
Total Protein: 6.3 g/dL (ref 6.0–8.5)
eGFR: 36 mL/min/{1.73_m2} — ABNORMAL LOW (ref >59)

## 2024-03-11 ENCOUNTER — Other Ambulatory Visit: Payer: Self-pay | Admitting: Family Medicine

## 2024-03-11 DIAGNOSIS — E1169 Type 2 diabetes mellitus with other specified complication: Secondary | ICD-10-CM

## 2024-03-18 ENCOUNTER — Ambulatory Visit

## 2024-03-19 ENCOUNTER — Ambulatory Visit (INDEPENDENT_AMBULATORY_CARE_PROVIDER_SITE_OTHER)

## 2024-03-19 VITALS — BP 128/76 | HR 75 | Ht 64.0 in | Wt 202.0 lb

## 2024-03-19 DIAGNOSIS — Z0000 Encounter for general adult medical examination without abnormal findings: Secondary | ICD-10-CM | POA: Diagnosis not present

## 2024-03-19 NOTE — Progress Notes (Signed)
 Subjective:   Kristina Peterson is a 78 y.o. who presents for a Medicare Wellness preventive visit.  As a reminder, Annual Wellness Visits don't include a physical exam, and some assessments may be limited, especially if this visit is performed virtually. We may recommend an in-person follow-up visit with your provider if needed.  Visit Complete: Virtual I connected with  Kristina Peterson on 03/19/24 by a audio enabled telemedicine application and verified that I am speaking with the correct person using two identifiers.  Patient Location: Home  Provider Location: Home Office  I discussed the limitations of evaluation and management by telemedicine. The patient expressed understanding and agreed to proceed.  Vital Signs: Because this visit was a virtual/telehealth visit, some criteria may be missing or patient reported. Any vitals not documented were not able to be obtained and vitals that have been documented are patient reported.  VideoDeclined- This patient declined Librarian, academic. Therefore the visit was completed with audio only.  Persons Participating in Visit: Patient.  AWV Questionnaire: No: Patient Medicare AWV questionnaire was not completed prior to this visit.  Cardiac Risk Factors include: advanced age (>1men, >70 women);dyslipidemia;hypertension;obesity (BMI >30kg/m2)     Objective:    Today's Vitals   03/19/24 1026  BP: 128/76  Pulse: 75  Weight: 202 lb (91.6 kg)  Height: 5' 4 (1.626 m)   Body mass index is 34.67 kg/m.     03/19/2024   10:12 AM 12/13/2022   11:19 AM 04/26/2022   11:15 AM 02/11/2015    1:47 PM 12/03/2014   11:25 AM  Advanced Directives  Does Patient Have a Medical Advance Directive? Yes Yes Yes No  No   Type of Advance Directive Living will Healthcare Power of [place];Living will Healthcare Power of [place];Living will    Does patient want to make changes to medical advance directive?  No - Patient declined No -  Patient declined No - Patient declined    Copy of Healthcare Power of Attorney in Chart?  No - copy requested No - copy requested    Would patient like information on creating a medical advance directive?     No - patient declined information      Data saved with a previous flowsheet row definition    Current Medications (verified) Outpatient Encounter Medications as of 03/19/2024  Medication Sig   aspirin EC 81 MG tablet Take 81 mg by mouth daily.   atorvastatin  (LIPITOR) 80 MG tablet Take 1 tablet (80 mg total) by mouth at bedtime.   folic acid (FOLVITE) 1 MG tablet Take by mouth.   ibuprofen (ADVIL) 200 MG tablet Take by mouth.   lisinopril  (ZESTRIL ) 10 MG tablet Take 1 tablet (10 mg total) by mouth daily.   metoprolol  succinate (TOPROL -XL) 50 MG 24 hr tablet Take 1 tablet (50 mg total) by mouth daily.   omega-3 fish oil (MAXEPA) 1000 MG CAPS capsule Take 1 capsule by mouth daily.   omeprazole (PRILOSEC) 20 MG capsule Take 20 mg by mouth daily.   OZEMPIC , 1 MG/DOSE, 4 MG/3ML SOPN INJECT 1 MG SUBCUTANEOUSLY ONCE A WEEK   escitalopram  (LEXAPRO ) 10 MG tablet Take 1 tablet (10 mg total) by mouth daily. (Patient not taking: Reported on 03/19/2024)   No facility-administered encounter medications on file as of 03/19/2024.    Allergies (verified) No known allergies   History: Past Medical History:  Diagnosis Date   Diabetes mellitus without complication (HCC)    Hypertension  Past Surgical History:  Procedure Laterality Date   DILATION AND CURETTAGE OF UTERUS     HYSTEROSCOPY WITH D & C N/A 02/13/2015   Procedure: HYSTEROSCOPY, UTERINE CURETTAGE;  Surgeon: Vonn VEAR Inch, MD;  Location: [facility];  Service: Gynecology;  Laterality: N/A;   Family History  Problem Relation Age of Onset   Cancer Mother        cervical   Cancer Maternal Aunt        lung   Cancer Maternal Uncle    Diabetes Maternal Grandmother    Heart disease Paternal Grandfather    Social History   Socioeconomic  History   Marital status: Married    Spouse name: Not on file   Number of children: Not on file   Years of education: Not on file   Highest education level: Not on file  Occupational History   Not on file  Tobacco Use   Smoking status: Never   Smokeless tobacco: Never  Vaping Use   Vaping status: Never Used  Substance and Sexual Activity   Alcohol use: No    Alcohol/week: 0.0 standard drinks of alcohol   Drug use: No   Sexual activity: Not Currently  Other Topics Concern   Not on file  Social History Narrative   Not on file   Social Drivers of Health   Financial Resource Strain: Low Risk  (03/19/2024)   Overall Financial Resource Strain (CARDIA)    Difficulty of Paying Living Expenses: Not hard at all  Food Insecurity: No Food Insecurity (03/19/2024)   Hunger Vital Sign    Worried About Running Out of Food in the Last Year: Never true    Ran Out of Food in the Last Year: Never true  Transportation Needs: No Transportation Needs (12/13/2022)   PRAPARE - Administrator, Civil Service (Medical): No    Lack of Transportation (Non-Medical): No  Physical Activity: Inactive (03/19/2024)   Exercise Vital Sign    Days of Exercise per Week: 0 days    Minutes of Exercise per Session: 0 min  Stress: No Stress Concern Present (03/19/2024)   [employer] of Occupational Health - Occupational Stress Questionnaire    Feeling of Stress: Not at all  Social Connections: Moderately Integrated (03/19/2024)   Social Connection and Isolation Panel    Frequency of Communication with Friends and Family: More than three times a week    Frequency of Social Gatherings with Friends and Family: More than three times a week    Attends Religious Services: 1 to 4 times per year    Active Member of [employer] or Organizations: No    Attends Engineer, structural: Never    Marital Status: Married    Tobacco Counseling Counseling given: Yes    Clinical Intake:  Pre-visit  preparation completed: Yes  Pain : No/denies pain     BMI - recorded: 34.67 Nutritional Status: BMI > 30  Obese Nutritional Risks: None Diabetes: Yes  Lab Results  Component Value Date   HGBA1C 6.8 (H) 01/23/2024   HGBA1C 6.9 (H) 10/12/2023   HGBA1C 6.5 (H) 07/13/2023     How often do you need to have someone help you when you read instructions, pamphlets, or other written materials from your doctor or pharmacy?: 1 - Never  Interpreter Needed?: No  Information entered by :: alia t/cma   Activities of Daily Living     03/19/2024   10:07 AM  In your present state of health,  do you have any difficulty performing the following activities:  Hearing? 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 1  Comment pt goes to Dr. malen, [place]/last ov 2024  Difficulty concentrating or making decisions? 0  Walking or climbing stairs? 0  Dressing or bathing? 0  Doing errands, shopping? 1  Comment pt's daughter  Quarry manager and eating ? N  Using the Toilet? N  In the past six months, have you accidently leaked urine? Y  Do you have problems with loss of bowel control? N  Managing your Medications? N  Managing your Finances? N  Housekeeping or managing your Housekeeping? N    Patient Care Team: Severa Rock HERO, FNP as PCP - General (Family Medicine) [facility]  I have updated your Care Teams any recent Medical Services you may have received from other providers in the past year.     Assessment:   This is a routine wellness examination for [facility].  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 malen, [place]/last ov 2024   Goals Addressed             This Visit's Progress    Patient Stated   On track             04/26/2022 AWV Goal: Exercise for  General Health  Patient will verbalize understanding of the benefits of increased physical activity: Exercising regularly is important. It will improve your overall fitness, flexibility, and endurance. Regular exercise also will improve your overall health. It can help you control your weight, reduce stress, and improve your bone density. Over the next year, patient will increase physical activity as tolerated with a goal of at least 150 minutes of moderate physical activity per week.  You can tell that you are exercising at a moderate intensity if your heart starts beating faster and you start breathing faster but can still hold a conversation. Moderate-intensity exercise ideas include: Walking 1 mile (1.6 km) in about 15 minutes Biking Hiking Golfing Dancing Water aerobics Patient will verbalize understanding of everyday activities that increase physical activity by providing examples like the following: Yard work, such as: Insurance underwriter Gardening Washing windows or floors Patient will be able to explain general safety guidelines for exercising:  Before you start a new exercise program, talk with your health care provider. Do not exercise so much that you hurt yourself, feel dizzy, or get very short of breath. Wear comfortable clothes and wear shoes with good support. Drink plenty of water while you exercise to prevent dehydration or heat stroke. Work out until your breathing and your heartbeat get faster.                         Depression Screen     03/19/2024   10:22 AM 01/23/2024   12:02 PM 10/12/2023   10:24 AM 07/13/2023   10:16 AM 05/18/2023   10:36 AM 04/06/2023    2:28 PM 01/18/2023    3:08 PM  PHQ 2/9 Scores  PHQ - 2 Score 0 0 0 1 1 1 3   PHQ- 9 Score 0 2 3 2 4 3 6     Fall Risk  03/19/2024   10:05 AM 01/23/2024   12:02 PM 10/12/2023   10:24 AM 07/13/2023   10:15  AM 06/27/2023   10:05 AM  Fall Risk   Falls in the past year? 0 0 0 1 1  Number falls in past yr: 0 0  0 0  Injury with Fall? 0 0  0 0  Risk for fall due to : No Fall Risks No Fall Risks No Fall Risks History of fall(s) No Fall Risks;Other (Comment)  Risk for fall due to: Comment     Fell off porch  Follow up Falls evaluation completed Falls evaluation completed Falls evaluation completed Falls evaluation completed Falls evaluation completed    MEDICARE RISK AT HOME:  Medicare Risk at Home Any stairs in or around the home?: Yes If so, are there any without handrails?: Yes Home free of loose throw rugs in walkways, pet beds, electrical cords, etc?: Yes Adequate lighting in your home to reduce risk of falls?: Yes Life alert?: No Use of a cane, walker or w/c?: No Grab bars in the bathroom?: Yes Shower chair or bench in shower?: Yes Elevated toilet seat or a handicapped toilet?: No  TIMED UP AND GO:  Was the test performed?  no  Cognitive Function: 6CIT completed        03/19/2024   10:22 AM 12/13/2022   11:22 AM 04/26/2022   11:18 AM  6CIT Screen  What Year? 0 points 0 points 0 points  What month? 0 points 0 points 0 points  What time? 0 points 0 points 0 points  Count back from 20 0 points 0 points 0 points  Months in reverse 0 points 2 points 0 points  Repeat phrase 0 points 0 points 0 points  Total Score 0 points 2 points 0 points    Immunizations Immunization History  Administered Date(s) Administered   MODERNA COVID-19 SARS-COV-2 PEDS BIVALENT BOOSTER 13yr-62yr 06/26/2020   Moderna Sars-Covid-2 Vaccination 07/16/2020   Tdap 06/24/2022    Screening Tests Health Maintenance  Topic Date Due   COVID-19 Vaccine (3 - 2024-25 season) 03/26/2023   OPHTHALMOLOGY EXAM  11/28/2023   INFLUENZA VACCINE  02/23/2024   Zoster Vaccines- Shingrix (1 of 2) 04/24/2024 (Originally 09/05/1995)   DEXA SCAN  10/11/2024 (Originally 08/25/2023)   Pneumococcal Vaccine: 50+ Years (1 of 2  - PCV) 01/22/2025 (Originally 09/04/1964)   Hepatitis C Screening  01/22/2025 (Originally 09/05/1963)   HEMOGLOBIN A1C  07/25/2024   Diabetic kidney evaluation - Urine ACR  10/11/2024   FOOT EXAM  10/11/2024   Diabetic kidney evaluation - eGFR measurement  01/22/2025   Medicare Annual Wellness (AWV)  03/19/2025   DTaP/Tdap/Td (2 - Td or Tdap) 06/24/2032   HPV VACCINES  Aged Out   Meningococcal B Vaccine  Aged Out    Health Maintenance  Health Maintenance Due  Topic Date Due   COVID-19 Vaccine (3 - 2024-25 season) 03/26/2023   OPHTHALMOLOGY EXAM  11/28/2023   INFLUENZA VACCINE  02/23/2024   Health Maintenance Items Addressed: See Nurse Notes at the end of this note  Additional Screening:  Vision Screening: Recommended annual ophthalmology exams for early detection of glaucoma and other disorders of the eye. Would you like a referral to an eye doctor? No    Dental Screening: Recommended annual dental exams for proper oral hygiene  Community Resource Referral / Chronic Care Management: CRR required this visit?  No   CCM required this visit?  No   Plan:    I  have personally reviewed and noted the following in the patient's chart:   Medical and social history Use of alcohol, tobacco or illicit drugs  Current medications and supplements including opioid prescriptions. Patient is not currently taking opioid prescriptions. Functional ability and status Nutritional status Physical activity Advanced directives List of other physicians Hospitalizations, surgeries, and ER visits in previous 12 months Vitals Screenings to include cognitive, depression, and falls Referrals and appointments  In addition, I have reviewed and discussed with patient certain preventive protocols, quality metrics, and best practice recommendations. A written personalized care plan for preventive services as well as general preventive health recommendations were provided to patient.   Ozie Ned,  CMA   03/19/2024   After Visit Summary: (MyChart) Due to this being a telephonic visit, the after visit summary with patients personalized plan was offered to patient via MyChart   Notes: Nothing significant to report at this time.

## 2024-03-19 NOTE — Patient Instructions (Signed)
 Kristina Peterson , Thank you for taking time out of your busy schedule to complete your Annual Wellness Visit with me. I enjoyed our conversation and look forward to speaking with you again next year. I, as well as your care team,  appreciate your ongoing commitment to your health goals. Please review the following plan we discussed and let me know if I can assist you in the future. Your Game plan/ To Do List    Referrals: If you haven't heard from the office you've been referred to, please reach out to them at the phone provided.   Follow up Visits: We will see or speak with you next year for your Next Medicare AWV with our clinical staff on 03/20/25 at 10:00a.m. Have you seen your provider in the last 6 months (3 months if uncontrolled diabetes)? Yes  Clinician Recommendations:  Aim for 30 minutes of exercise or brisk walking, 6-8 glasses of water, and 5 servings of fruits and vegetables each day.       This is a list of the screenings recommended for you:  Health Maintenance  Topic Date Due   COVID-19 Vaccine (3 - 2024-25 season) 03/26/2023   Eye exam for diabetics  11/28/2023   Flu Shot  02/23/2024   Zoster (Shingles) Vaccine (1 of 2) 04/24/2024*   DEXA scan (bone density measurement)  10/11/2024*   Pneumococcal Vaccine for age over 38 (1 of 2 - PCV) 01/22/2025*   Hepatitis C Screening  01/22/2025*   Hemoglobin A1C  07/25/2024   Yearly kidney health urinalysis for diabetes  10/11/2024   Complete foot exam   10/11/2024   Yearly kidney function blood test for diabetes  01/22/2025   Medicare Annual Wellness Visit  03/19/2025   DTaP/Tdap/Td vaccine (2 - Td or Tdap) 06/24/2032   HPV Vaccine  Aged Out   Meningitis B Vaccine  Aged Out  *Topic was postponed. The date shown is not the original due date.    Advanced directives: (Copy Requested) Please bring a copy of your health care power of attorney and living will to the office to be added to your chart at your convenience. You can mail to  [facility] [address] [place], [place] [address] or email to [email or web address] Advance Care Planning is important because it:  [x]  Makes sure you receive the medical care that is consistent with your values, goals, and preferences  [x]  It provides guidance to your family and loved ones and reduces their decisional burden about whether or not they are making the right decisions based on your wishes.  Follow the link provided in your after visit summary or read over the paperwork we have mailed to you to help you started getting your Advance Directives in place. If you need assistance in completing these, please reach out to us  so that we can help you! See attachments for Preventive Care and Fall Prevention Tips.

## 2024-04-11 LAB — OPHTHALMOLOGY REPORT-SCANNED

## 2024-06-01 ENCOUNTER — Other Ambulatory Visit: Payer: Self-pay | Admitting: *Deleted

## 2024-06-01 DIAGNOSIS — E1169 Type 2 diabetes mellitus with other specified complication: Secondary | ICD-10-CM

## 2024-06-03 NOTE — Telephone Encounter (Signed)
 Michelle NTBS for 3 mos FU RF sent to pharmacy

## 2024-06-03 NOTE — Telephone Encounter (Signed)
 Appt scheduled for 06/06/2024

## 2024-06-06 ENCOUNTER — Ambulatory Visit (INDEPENDENT_AMBULATORY_CARE_PROVIDER_SITE_OTHER): Admitting: Family Medicine

## 2024-06-06 ENCOUNTER — Encounter: Payer: Self-pay | Admitting: Family Medicine

## 2024-06-06 ENCOUNTER — Ambulatory Visit: Payer: Self-pay | Admitting: Family Medicine

## 2024-06-06 VITALS — BP 138/76 | HR 71 | Temp 97.0°F | Ht 64.0 in | Wt 206.8 lb

## 2024-06-06 DIAGNOSIS — E1169 Type 2 diabetes mellitus with other specified complication: Secondary | ICD-10-CM | POA: Diagnosis not present

## 2024-06-06 DIAGNOSIS — N184 Chronic kidney disease, stage 4 (severe): Secondary | ICD-10-CM

## 2024-06-06 DIAGNOSIS — I152 Hypertension secondary to endocrine disorders: Secondary | ICD-10-CM

## 2024-06-06 DIAGNOSIS — Z961 Presence of intraocular lens: Secondary | ICD-10-CM | POA: Insufficient documentation

## 2024-06-06 DIAGNOSIS — N1832 Chronic kidney disease, stage 3b: Secondary | ICD-10-CM

## 2024-06-06 DIAGNOSIS — Z7985 Long-term (current) use of injectable non-insulin antidiabetic drugs: Secondary | ICD-10-CM

## 2024-06-06 DIAGNOSIS — E1122 Type 2 diabetes mellitus with diabetic chronic kidney disease: Secondary | ICD-10-CM | POA: Diagnosis not present

## 2024-06-06 DIAGNOSIS — E1159 Type 2 diabetes mellitus with other circulatory complications: Secondary | ICD-10-CM | POA: Diagnosis not present

## 2024-06-06 DIAGNOSIS — E785 Hyperlipidemia, unspecified: Secondary | ICD-10-CM

## 2024-06-06 LAB — BAYER DCA HB A1C WAIVED: HB A1C (BAYER DCA - WAIVED): 6.1 % — ABNORMAL HIGH (ref 4.8–5.6)

## 2024-06-06 NOTE — Progress Notes (Signed)
 Subjective:  Patient ID: Kristina Peterson, female    DOB: 1945-11-28, 78 y.o.   MRN: [id number]  Patient Care Team: Severa Rock HERO, FNP as PCP - General (Family Medicine) [facility]   Chief Complaint:  Medical Management of Chronic Issues   HPI: Kristina Peterson is a 78 y.o. female presenting on 06/06/2024 for Medical Management of Chronic Issues   Kristina Peterson is a 78 year old female who presents for follow-up on her balance and dizziness.  Gait disturbance and dizziness - Ongoing balance issues with unsteady, 'waddling' gait - Difficulty walking in a straight line - Able to drive short distances - Completed vestibular rehabilitation with partial improvement but persistent symptoms - No headaches, chest pain, or leg swelling  Glycemic control - Elevated blood sugars managed with Ozempic  1 mg weekly - No nausea, vomiting, or constipation from Ozempic  - A1c 6.1, lowest in recent history - Occasional higher blood sugar after coffee consumption  Hypertension - Blood pressure managed with lisinopril  and Toprolol - Slightly elevated blood pressure today, attributed to situational factors  Hyperlipidemia - Cholesterol managed with atorvastatin  and fish oil - No muscle aches or pains  Ophthalmologic symptoms and procedures - History of cataract surgery with subsequent laser procedure for posterior capsule opacification - Sees a 'little line' in vision when moving eyes in certain directions - Scheduled for eyelid surgery in June due to visual impact - No visual changes aside from the noted line        Relevant past medical, surgical, family, and social history reviewed and updated as indicated.  Allergies and medications reviewed and updated. Data reviewed: Chart in Epic.   Past Medical History:  Diagnosis Date   Diabetes mellitus without complication (HCC)    Hypertension     Past Surgical History:  Procedure Laterality Date   DILATION AND  CURETTAGE OF UTERUS     HYSTEROSCOPY WITH D & C N/A 02/13/2015   Procedure: HYSTEROSCOPY, UTERINE CURETTAGE;  Surgeon: Vonn VEAR Inch, MD;  Location: [facility];  Service: Gynecology;  Laterality: N/A;    Social History   Socioeconomic History   Marital status: Married    Spouse name: Not on file   Number of children: Not on file   Years of education: Not on file   Highest education level: Not on file  Occupational History   Not on file  Tobacco Use   Smoking status: Never   Smokeless tobacco: Never  Vaping Use   Vaping status: Never Used  Substance and Sexual Activity   Alcohol use: No    Alcohol/week: 0.0 standard drinks of alcohol   Drug use: No   Sexual activity: Not Currently  Other Topics Concern   Not on file  Social History Narrative   Not on file   Social Drivers of Health   Financial Resource Strain: Low Risk  (03/19/2024)   Overall Financial Resource Strain (CARDIA)    Difficulty of Paying Living Expenses: Not hard at all  Food Insecurity: No Food Insecurity (03/19/2024)   Hunger Vital Sign    Worried About Running Out of Food in the Last Year: Never true    Ran Out of Food in the Last Year: Never true  Transportation Needs: No Transportation Needs (12/13/2022)   PRAPARE - Administrator, Civil Service (Medical): No    Lack of Transportation (Non-Medical): No  Physical Activity: Inactive (03/19/2024)   Exercise Vital Sign  Days of Exercise per Week: 0 days    Minutes of Exercise per Session: 0 min  Stress: No Stress Concern Present (03/19/2024)   [employer] of Occupational Health - Occupational Stress Questionnaire    Feeling of Stress: Not at all  Social Connections: Moderately Integrated (03/19/2024)   Social Connection and Isolation Panel    Frequency of Communication with Friends and Family: More than three times a week    Frequency of Social Gatherings with Friends and Family: More than three times a week    Attends Religious Services: 1  to 4 times per year    Active Member of [employer] or Organizations: No    Attends Banker Meetings: Never    Marital Status: Married  Catering Manager Violence: Not At Risk (03/19/2024)   Humiliation, Afraid, Rape, and Kick questionnaire    Fear of Current or Ex-Partner: No    Emotionally Abused: No    Physically Abused: No    Sexually Abused: No    Outpatient Encounter Medications as of 06/06/2024  Medication Sig   aspirin EC 81 MG tablet Take 81 mg by mouth daily.   atorvastatin  (LIPITOR) 80 MG tablet Take 1 tablet (80 mg total) by mouth at bedtime.   folic acid (FOLVITE) 1 MG tablet Take by mouth.   ibuprofen (ADVIL) 200 MG tablet Take by mouth.   lisinopril  (ZESTRIL ) 10 MG tablet Take 1 tablet (10 mg total) by mouth daily.   metoprolol  succinate (TOPROL -XL) 50 MG 24 hr tablet Take 1 tablet (50 mg total) by mouth daily.   omega-3 fish oil (MAXEPA) 1000 MG CAPS capsule Take 1 capsule by mouth daily.   omeprazole (PRILOSEC) 20 MG capsule Take 20 mg by mouth daily.   OZEMPIC , 1 MG/DOSE, 4 MG/3ML SOPN INJECT 1 MG SUBCUTANEOUSLY ONCE A WEEK   [DISCONTINUED] escitalopram  (LEXAPRO ) 10 MG tablet Take 1 tablet (10 mg total) by mouth daily. (Patient not taking: Reported on 03/19/2024)   No facility-administered encounter medications on file as of 06/06/2024.    Allergies  Allergen Reactions   No Known Allergies     Pertinent ROS per HPI, otherwise unremarkable      Objective:  BP 138/76   Pulse 71   Temp (!) 97 F (36.1 C)   Ht 5' 4 (1.626 m)   Wt 206 lb 12.8 oz (93.8 kg)   SpO2 95%   BMI 35.50 kg/m    Wt Readings from Last 3 Encounters:  06/06/24 206 lb 12.8 oz (93.8 kg)  03/19/24 202 lb (91.6 kg)  01/23/24 202 lb 3.2 oz (91.7 kg)    Physical Exam Vitals and nursing note reviewed.  Constitutional:      General: She is not in acute distress.    Appearance: Normal appearance. She is obese. She is not ill-appearing, toxic-appearing or diaphoretic.  HENT:      Head: Normocephalic and atraumatic.     Nose: Nose normal.     Mouth/Throat:     Mouth: Mucous membranes are moist.  Eyes:     Conjunctiva/sclera: Conjunctivae normal.     Pupils: Pupils are equal, round, and reactive to light.  Cardiovascular:     Rate and Rhythm: Normal rate and regular rhythm.     Heart sounds: Normal heart sounds.  Pulmonary:     Effort: Pulmonary effort is normal.     Breath sounds: Normal breath sounds.  Musculoskeletal:     Cervical back: Neck supple.     Right lower leg:  No edema.     Left lower leg: No edema.  Skin:    General: Skin is warm and dry.     Capillary Refill: Capillary refill takes less than 2 seconds.  Neurological:     General: No focal deficit present.     Mental Status: She is alert and oriented to person, place, and time.  Psychiatric:        Mood and Affect: Mood normal.        Behavior: Behavior normal.        Thought Content: Thought content normal.        Judgment: Judgment normal.       Results for orders placed or performed in visit on 01/23/24  Bayer DCA Hb A1c Waived   Collection Time: 01/23/24 12:05 PM  Result Value Ref Range   HB A1C (BAYER DCA - WAIVED) 6.8 (H) 4.8 - 5.6 %  CMP14+EGFR   Collection Time: 01/23/24 12:07 PM  Result Value Ref Range   Glucose 154 (H) 70 - 99 mg/dL   BUN 17 8 - 27 mg/dL   Creatinine, Ser 8.52 (H) 0.57 - 1.00 mg/dL   eGFR 36 (L) >40 fO/fpw/8.26   BUN/Creatinine Ratio 12 12 - 28   Sodium 138 134 - 144 mmol/L   Potassium 5.0 3.5 - 5.2 mmol/L   Chloride 101 96 - 106 mmol/L   CO2 20 20 - 29 mmol/L   Calcium  9.5 8.7 - 10.3 mg/dL   Total Protein 6.3 6.0 - 8.5 g/dL   Albumin 4.3 3.8 - 4.8 g/dL   Globulin, Total 2.0 1.5 - 4.5 g/dL   Bilirubin Total 0.6 0.0 - 1.2 mg/dL   Alkaline Phosphatase 81 44 - 121 IU/L   AST 18 0 - 40 IU/L   ALT 23 0 - 32 IU/L       Pertinent labs & imaging results that were available during my care of the patient were reviewed by me and considered in my medical  decision making.  Assessment & Plan:  Kurstin Dimarzo was seen today for medical management of chronic issues.  Diagnoses and all orders for this visit:  Type 2 diabetes mellitus with stage 4 chronic kidney disease, without long-term current use of insulin (HCC) -     Bayer DCA Hb A1c Waived -     CMP14+EGFR -     Lipid panel -     CBC with Differential/Platelet  Stage 3b chronic kidney disease (HCC) -     Bayer DCA Hb A1c Waived -     CMP14+EGFR -     Lipid panel -     CBC with Differential/Platelet  Hyperlipidemia associated with type 2 diabetes mellitus (HCC) -     Bayer DCA Hb A1c Waived -     CMP14+EGFR -     Lipid panel -     CBC with Differential/Platelet  Hypertension associated with diabetes (HCC) -     Bayer DCA Hb A1c Waived -     CMP14+EGFR -     Lipid panel -     CBC with Differential/Platelet  Morbid obesity (HCC) -     Bayer DCA Hb A1c Waived -     CMP14+EGFR -     Lipid panel -     CBC with Differential/Platelet       Vestibular dysfunction Chronic vestibular dysfunction with persistent symptoms despite previous vestibular rehabilitation. ENT consultation allowed driving short distances. Differential diagnosis includes Ramsey Hunt syndrome and shingles, but  no definitive diagnosis. MRI and blood tests were inconclusive.  Type 2 diabetes mellitus Well-controlled with an A1c of 6.1, improved from 6.8. No reported side effects from Ozempic . - Continue Ozempic  1 mg weekly  Hypertension Blood pressure slightly elevated today, likely due to stress. Current medications include lisinopril  and Toprolol. - Continue lisinopril  and Toprolol  Hyperlipidemia Managed with atorvastatin  and fish oil. No reported muscle aches or pains. - Continue atorvastatin  and fish oil  Chronic kidney disease, stage 3b Chronic kidney disease stage 3b with declining renal function. No nephrology consultation yet. - Ordered renal function tests - Will refer to nephrologist if  renal function worsens  Cataract, status post surgery Status post cataract surgery with recent laser procedure for cloudy lens. Follow-up scheduled for further evaluation. - Continue follow-up with ophthalmologist  Planned eyelid surgery for visual impairment Eyelid surgery scheduled for June to address visual impairment. Insurance coverage anticipated due to significant vision impact. - Proceed with scheduled eyelid surgery          Continue all other maintenance medications.  Follow up plan: Return in about 3 months (around 09/06/2024) for chronic follow up, all labs.   Continue healthy lifestyle choices, including diet (rich in fruits, vegetables, and lean proteins, and low in salt and simple carbohydrates) and exercise (at least 30 minutes of moderate physical activity daily).  Educational handout given for DM  The above assessment and management plan was discussed with the patient. The patient verbalized understanding of and has agreed to the management plan. Patient is aware to call the clinic if they develop any new symptoms or if symptoms persist or worsen. Patient is aware when to return to the clinic for a follow-up visit. Patient educated on when it is appropriate to go to the emergency department.   Rosaline Bruns, FNP-C [facility] [phone]

## 2024-06-06 NOTE — Patient Instructions (Addendum)

## 2024-06-07 LAB — CMP14+EGFR
ALT: 23 IU/L (ref 0–32)
AST: 21 IU/L (ref 0–40)
Albumin: 4.2 g/dL (ref 3.8–4.8)
Alkaline Phosphatase: 85 IU/L (ref 49–135)
BUN/Creatinine Ratio: 8 — ABNORMAL LOW (ref 12–28)
BUN: 11 mg/dL (ref 8–27)
Bilirubin Total: 0.6 mg/dL (ref 0.0–1.2)
CO2: 24 mmol/L (ref 20–29)
Calcium: 9.3 mg/dL (ref 8.7–10.3)
Chloride: 99 mmol/L (ref 96–106)
Creatinine, Ser: 1.35 mg/dL — ABNORMAL HIGH (ref 0.57–1.00)
Globulin, Total: 2 g/dL (ref 1.5–4.5)
Glucose: 121 mg/dL — ABNORMAL HIGH (ref 70–99)
Potassium: 4.1 mmol/L (ref 3.5–5.2)
Sodium: 135 mmol/L (ref 134–144)
Total Protein: 6.2 g/dL (ref 6.0–8.5)
eGFR: 40 mL/min/1.73 — ABNORMAL LOW (ref >59)

## 2024-06-07 LAB — CBC WITH DIFFERENTIAL/PLATELET
Basophils Absolute: 0.1 x10E3/uL (ref 0.0–0.2)
Basos: 1 %
EOS (ABSOLUTE): 0.1 x10E3/uL (ref 0.0–0.4)
Eos: 1 %
Hematocrit: 36.3 % (ref 34.0–46.6)
Hemoglobin: 12.6 g/dL (ref 11.1–15.9)
Immature Grans (Abs): 0 x10E3/uL (ref 0.0–0.1)
Immature Granulocytes: 0 %
Lymphocytes Absolute: 1.9 x10E3/uL (ref 0.7–3.1)
Lymphs: 35 %
MCH: 31.7 pg (ref 26.6–33.0)
MCHC: 34.7 g/dL (ref 31.5–35.7)
MCV: 91 fL (ref 79–97)
Monocytes Absolute: 0.3 x10E3/uL (ref 0.1–0.9)
Monocytes: 5 %
Neutrophils Absolute: 3.1 x10E3/uL (ref 1.4–7.0)
Neutrophils: 58 %
Platelets: 183 x10E3/uL (ref 150–450)
RBC: 3.97 x10E6/uL (ref 3.77–5.28)
RDW: 12.6 % (ref 11.7–15.4)
WBC: 5.3 x10E3/uL (ref 3.4–10.8)

## 2024-06-07 LAB — LIPID PANEL
Chol/HDL Ratio: 2.2 ratio (ref 0.0–4.4)
Cholesterol, Total: 128 mg/dL (ref 100–199)
HDL: 58 mg/dL (ref >39)
LDL Chol Calc (NIH): 50 mg/dL (ref 0–99)
Triglycerides: 113 mg/dL (ref 0–149)
VLDL Cholesterol Cal: 20 mg/dL (ref 5–40)

## 2024-07-28 ENCOUNTER — Other Ambulatory Visit: Payer: Self-pay | Admitting: Family Medicine

## 2024-07-28 DIAGNOSIS — E1122 Type 2 diabetes mellitus with diabetic chronic kidney disease: Secondary | ICD-10-CM

## 2024-07-28 DIAGNOSIS — E1159 Type 2 diabetes mellitus with other circulatory complications: Secondary | ICD-10-CM

## 2024-07-28 DIAGNOSIS — I152 Hypertension secondary to endocrine disorders: Secondary | ICD-10-CM

## 2024-07-28 DIAGNOSIS — N184 Chronic kidney disease, stage 4 (severe): Secondary | ICD-10-CM

## 2024-07-28 DIAGNOSIS — E1169 Type 2 diabetes mellitus with other specified complication: Secondary | ICD-10-CM

## 2024-07-31 ENCOUNTER — Encounter: Payer: Self-pay | Admitting: *Deleted

## 2024-09-12 ENCOUNTER — Ambulatory Visit: Admitting: Family Medicine

## 2024-09-13 ENCOUNTER — Ambulatory Visit: Admitting: Family Medicine

## 2025-03-20 ENCOUNTER — Ambulatory Visit: Payer: Self-pay
